# Patient Record
Sex: Female | Born: 1995
Health system: Southern US, Community
[De-identification: ages and names within clinical notes are randomized; demographics above are authoritative.]

## PROBLEM LIST (undated history)

## (undated) ENCOUNTER — Emergency Department: Admission: EM | Payer: Medicaid Other | Source: Home / Self Care

## (undated) DIAGNOSIS — I10 Essential (primary) hypertension: Secondary | ICD-10-CM

## (undated) DIAGNOSIS — F419 Anxiety disorder, unspecified: Secondary | ICD-10-CM

## (undated) DIAGNOSIS — G43909 Migraine, unspecified, not intractable, without status migrainosus: Secondary | ICD-10-CM

## (undated) DIAGNOSIS — K219 Gastro-esophageal reflux disease without esophagitis: Secondary | ICD-10-CM

## (undated) DIAGNOSIS — M1990 Unspecified osteoarthritis, unspecified site: Secondary | ICD-10-CM

## (undated) DIAGNOSIS — I428 Other cardiomyopathies: Secondary | ICD-10-CM

## (undated) DIAGNOSIS — I4891 Unspecified atrial fibrillation: Secondary | ICD-10-CM

## (undated) HISTORY — DX: Unspecified osteoarthritis, unspecified site: M19.90

## (undated) HISTORY — PX: CLUB FOOT RELEASE: SHX1363

## (undated) HISTORY — PX: WISDOM TOOTH EXTRACTION: SHX21

## (undated) HISTORY — DX: Essential (primary) hypertension: I10

## (undated) HISTORY — DX: Other cardiomyopathies: I42.8

## (undated) HISTORY — DX: Anxiety disorder, unspecified: F41.9

## (undated) HISTORY — DX: Gastro-esophageal reflux disease without esophagitis: K21.9

## (undated) HISTORY — DX: Migraine, unspecified, not intractable, without status migrainosus: G43.909

## (undated) HISTORY — DX: Unspecified atrial fibrillation: I48.91

---
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
## 2015-05-27 DIAGNOSIS — M25571 Pain in right ankle and joints of right foot: Secondary | ICD-10-CM | POA: Insufficient documentation

## 2017-09-30 DIAGNOSIS — G43119 Migraine with aura, intractable, without status migrainosus: Secondary | ICD-10-CM | POA: Insufficient documentation

## 2019-06-29 ENCOUNTER — Other Ambulatory Visit: Payer: Self-pay

## 2019-06-29 ENCOUNTER — Encounter: Payer: Medicaid Other | Admitting: Sports Medicine

## 2019-07-03 ENCOUNTER — Ambulatory Visit (INDEPENDENT_AMBULATORY_CARE_PROVIDER_SITE_OTHER): Payer: BC Managed Care – PPO | Admitting: Sports Medicine

## 2019-07-03 ENCOUNTER — Encounter: Payer: Self-pay | Admitting: Sports Medicine

## 2019-07-03 ENCOUNTER — Other Ambulatory Visit: Payer: Self-pay

## 2019-07-03 ENCOUNTER — Ambulatory Visit: Payer: BC Managed Care – PPO

## 2019-07-03 DIAGNOSIS — S63641A Sprain of metacarpophalangeal joint of right thumb, initial encounter: Secondary | ICD-10-CM

## 2019-07-03 DIAGNOSIS — S6991XA Unspecified injury of right wrist, hand and finger(s), initial encounter: Secondary | ICD-10-CM

## 2019-07-03 HISTORY — DX: Sprain of metacarpophalangeal joint of right thumb, initial encounter: S63.641A

## 2019-07-03 MED ORDER — MELOXICAM 15 MG PO TABS: ORAL_TABLET | ORAL | 3 refills | Status: DC

## 2019-07-03 NOTE — Progress Notes (Signed)
Subjective:    CC: Right thumb pain  HPI:  This is a pleasant 23 year old female, she works at [employer], over a month ago she injured her right thumb, pain was predominantly at the thumb IP joint.  It never fully got better and then she had a recent reinjury.  Pain is moderate, persistent, localized at the IP joint without radiation.  I reviewed the past medical history, family history, social history, surgical history, and allergies today and no changes were needed.  Please see the problem list section below in epic for further details.  Past Medical History: Past Medical History:  Diagnosis Date  . Arrhythmogenic right ventricular cardiomyopathy (HCC)   . Atrial fibrillation (HCC)   . Migraine    Past Surgical History: Past Surgical History:  Procedure Laterality Date  . CLUB FOOT RELEASE    . WISDOM TOOTH EXTRACTION    . WISDOM TOOTH EXTRACTION     Social History: Social History   Socioeconomic History  . Marital status: Single    Spouse name: Not on file  . Number of children: Not on file  . Years of education: Not on file  . Highest education level: Not on file  Occupational History  . Not on file  Social Needs  . Financial resource strain: Not on file  . Food insecurity    Worry: Not on file    Inability: Not on file  . Transportation needs    Medical: Not on file    Non-medical: Not on file  Tobacco Use  . Smoking status: Never Smoker  . Smokeless tobacco: Never Used  Substance and Sexual Activity  . Alcohol use: Not Currently  . Drug use: Never  . Sexual activity: Not on file  Lifestyle  . Physical activity    Days per week: Not on file    Minutes per session: Not on file  . Stress: Not on file  Relationships  . Social Musician on phone: Not on file    Gets together: Not on file    Attends religious service: Not on file    Active member of club or organization: Not on file    Attends meetings of clubs or organizations: Not on file   Relationship status: Not on file  Other Topics Concern  . Not on file  Social History Narrative  . Not on file   Family History: Family History  Problem Relation Age of Onset  . Hypertension Father   . Diabetes Paternal Grandmother   . Diabetes Paternal Grandfather    Allergies: Allergies  Allergen Reactions  . Oxycodone Nausea And Vomiting   Medications: See med rec.  Review of Systems: No headache, visual changes, nausea, vomiting, diarrhea, constipation, dizziness, abdominal pain, skin rash, fevers, chills, night sweats, swollen lymph nodes, weight loss, chest pain, body aches, joint swelling, muscle aches, shortness of breath, mood changes, visual or auditory hallucinations.  Objective:    General: Well Developed, well nourished, and in no acute distress.  Neuro: Alert and oriented x3, extra-ocular muscles intact, sensation grossly intact.  HEENT: Normocephalic, atraumatic, pupils equal round reactive to light, neck supple, no masses, no lymphadenopathy, thyroid nonpalpable.  Skin: Warm and dry, no rashes noted.  Cardiac: Regular rate and rhythm, no murmurs rubs or gallops.  Respiratory: Clear to auscultation bilaterally. Not using accessory muscles, speaking in full sentences.  Abdominal: Soft, nontender, nondistended, positive bowel sounds, no masses, no organomegaly.  Right hand: Good motion, good strength, tender to palpation  at the IP joint.  Collaterals are stable, neurovascularly intact distally.  Impression and Recommendations:    The patient was counselled, risk factors were discussed, anticipatory guidance given.  Injury of thumb, right Injury over a month ago, x-rays unremarkable, I do think she had an occult fracture. Thumb spica brace, switching to meloxicam, MRI of the right thumb. Out of work for 2 weeks, I do expect we will have to fill out short-term disability as she does work for [employer]. Return to see me in 2 weeks.    ___________________________________________ Gwen Her. Dianah Field, M.D., ABFM., CAQSM. Primary Care and Sports Medicine [facility]  Adjunct Professor of [place] [employer] [facility] [employer]

## 2019-07-03 NOTE — Assessment & Plan Note (Signed)
Injury over a month ago, x-rays unremarkable, I do think she had an occult fracture. Thumb spica brace, switching to meloxicam, MRI of the right thumb. Out of work for 2 weeks, I do expect we will have to fill out short-term disability as she does work for [employer]. Return to see me in 2 weeks.

## 2019-07-17 ENCOUNTER — Ambulatory Visit: Payer: Medicaid Other | Admitting: Sports Medicine

## 2019-07-18 ENCOUNTER — Encounter: Payer: Self-pay | Admitting: Sports Medicine

## 2019-07-18 ENCOUNTER — Other Ambulatory Visit: Payer: Self-pay

## 2019-07-18 ENCOUNTER — Ambulatory Visit (INDEPENDENT_AMBULATORY_CARE_PROVIDER_SITE_OTHER): Payer: BC Managed Care – PPO | Admitting: Sports Medicine

## 2019-07-18 DIAGNOSIS — S6991XD Unspecified injury of right wrist, hand and finger(s), subsequent encounter: Secondary | ICD-10-CM | POA: Diagnosis not present

## 2019-07-18 NOTE — Assessment & Plan Note (Addendum)
Thumb injury back in August, x-rays were unremarkable, I thought she had an occult fracture. We are still awaiting MRI which is scheduled for tomorrow, continue meloxicam. Short-term disability paperwork filled out and faxed off today. Follow-up will depend on MRI results.  MRI shows UCL sprain, she will likely need a spica cast for a month.

## 2019-07-18 NOTE — Progress Notes (Addendum)
Subjective:    CC: Follow-up  HPI: Vira returns, she is a pleasant 23 year old female with a thumb injury.  She is still awaiting MRI, she also had some disability paperwork to fill out today.  I reviewed the past medical history, family history, social history, surgical history, and allergies today and no changes were needed.  Please see the problem list section below in epic for further details.  Past Medical History: Past Medical History:  Diagnosis Date  . Arrhythmogenic right ventricular cardiomyopathy (HCC)   . Atrial fibrillation (HCC)   . Migraine    Past Surgical History: Past Surgical History:  Procedure Laterality Date  . CLUB FOOT RELEASE    . WISDOM TOOTH EXTRACTION    . WISDOM TOOTH EXTRACTION     Social History: Social History   Socioeconomic History  . Marital status: Single    Spouse name: Not on file  . Number of children: Not on file  . Years of education: Not on file  . Highest education level: Not on file  Occupational History  . Not on file  Social Needs  . Financial resource strain: Not on file  . Food insecurity    Worry: Not on file    Inability: Not on file  . Transportation needs    Medical: Not on file    Non-medical: Not on file  Tobacco Use  . Smoking status: Never Smoker  . Smokeless tobacco: Never Used  Substance and Sexual Activity  . Alcohol use: Not Currently  . Drug use: Never  . Sexual activity: Not on file  Lifestyle  . Physical activity    Days per week: Not on file    Minutes per session: Not on file  . Stress: Not on file  Relationships  . Social Musician on phone: Not on file    Gets together: Not on file    Attends religious service: Not on file    Active member of club or organization: Not on file    Attends meetings of clubs or organizations: Not on file    Relationship status: Not on file  Other Topics Concern  . Not on file  Social History Narrative  . Not on file   Family History:  Family History  Problem Relation Age of Onset  . Hypertension Father   . Diabetes Paternal Grandmother   . Diabetes Paternal Grandfather    Allergies: Allergies  Allergen Reactions  . Oxycodone Nausea And Vomiting   Medications: See med rec.  Review of Systems: No fevers, chills, night sweats, weight loss, chest pain, or shortness of breath.   Objective:    General: Well Developed, well nourished, and in no acute distress.  Neuro: Alert and oriented x3, extra-ocular muscles intact, sensation grossly intact.  HEENT: Normocephalic, atraumatic, pupils equal round reactive to light, neck supple, no masses, no lymphadenopathy, thyroid nonpalpable.  Skin: Warm and dry, no rashes. Cardiac: Regular rate and rhythm, no murmurs rubs or gallops, no lower extremity edema.  Respiratory: Clear to auscultation bilaterally. Not using accessory muscles, speaking in full sentences.  Disability paperwork filled out.  Impression and Recommendations:    Injury of thumb, right Thumb injury back in August, x-rays were unremarkable, I thought she had an occult fracture. We are still awaiting MRI which is scheduled for tomorrow, continue meloxicam. Short-term disability paperwork filled out and faxed off today. Follow-up will depend on MRI results.  MRI shows UCL sprain, she will likely need a spica cast  for a month.   ___________________________________________ Gwen Her. Dianah Field, M.D., ABFM., CAQSM. Primary Care and Sports Medicine [facility]  Adjunct Professor of [place] of [employer] Medicine

## 2019-07-23 NOTE — Progress Notes (Signed)
Patient was notified and did not have any questions.  

## 2019-08-01 ENCOUNTER — Encounter: Payer: Self-pay | Admitting: Sports Medicine

## 2019-08-01 ENCOUNTER — Ambulatory Visit (INDEPENDENT_AMBULATORY_CARE_PROVIDER_SITE_OTHER): Payer: BC Managed Care – PPO | Admitting: Sports Medicine

## 2019-08-01 ENCOUNTER — Other Ambulatory Visit: Payer: Self-pay

## 2019-08-01 DIAGNOSIS — S63641D Sprain of metacarpophalangeal joint of right thumb, subsequent encounter: Secondary | ICD-10-CM

## 2019-08-01 NOTE — Assessment & Plan Note (Addendum)
UCL partial tear on MRI. We placed her in a spica Exos cast today, I anticipate 4 to 6 weeks of casting, she will be out of work until then. I filled out part of her disability and FMLA paperwork today, she has a part that her boss needs to sign, she will return them tomorrow and we can fax them off and scan them.

## 2019-08-01 NOTE — Progress Notes (Signed)
Subjective:    CC: Follow-up  HPI: Anastassia returns, she is a pleasant 23 year old female who suffered an injury to her right thumb, ultimately an MRI showed partial tear of the UCL, she is here for further evaluation.  I reviewed the past medical history, family history, social history, surgical history, and allergies today and no changes were needed.  Please see the problem list section below in epic for further details.  Past Medical History: Past Medical History:  Diagnosis Date  . Arrhythmogenic right ventricular cardiomyopathy (HCC)   . Atrial fibrillation (HCC)   . Migraine    Past Surgical History: Past Surgical History:  Procedure Laterality Date  . CLUB FOOT RELEASE    . WISDOM TOOTH EXTRACTION    . WISDOM TOOTH EXTRACTION     Social History: Social History   Socioeconomic History  . Marital status: Single    Spouse name: Not on file  . Number of children: Not on file  . Years of education: Not on file  . Highest education level: Not on file  Occupational History  . Not on file  Social Needs  . Financial resource strain: Not on file  . Food insecurity    Worry: Not on file    Inability: Not on file  . Transportation needs    Medical: Not on file    Non-medical: Not on file  Tobacco Use  . Smoking status: Never Smoker  . Smokeless tobacco: Never Used  Substance and Sexual Activity  . Alcohol use: Not Currently  . Drug use: Never  . Sexual activity: Not on file  Lifestyle  . Physical activity    Days per week: Not on file    Minutes per session: Not on file  . Stress: Not on file  Relationships  . Social Musician on phone: Not on file    Gets together: Not on file    Attends religious service: Not on file    Active member of club or organization: Not on file    Attends meetings of clubs or organizations: Not on file    Relationship status: Not on file  Other Topics Concern  . Not on file  Social History Narrative  . Not on file    Family History: Family History  Problem Relation Age of Onset  . Hypertension Father   . Diabetes Paternal Grandmother   . Diabetes Paternal Grandfather    Allergies: Allergies  Allergen Reactions  . Oxycodone Nausea And Vomiting   Medications: See med rec.  Review of Systems: No fevers, chills, night sweats, weight loss, chest pain, or shortness of breath.   Objective:    General: Well Developed, well nourished, and in no acute distress.  Neuro: Alert and oriented x3, extra-ocular muscles intact, sensation grossly intact.  HEENT: Normocephalic, atraumatic, pupils equal round reactive to light, neck supple, no masses, no lymphadenopathy, thyroid nonpalpable.  Skin: Warm and dry, no rashes. Cardiac: Regular rate and rhythm, no murmurs rubs or gallops, no lower extremity edema.  Respiratory: Clear to auscultation bilaterally. Not using accessory muscles, speaking in full sentences.  Right sided EXOS a spica cast placed.  Impression and Recommendations:    Sprain of ulnar collateral ligament of metacarpophalangeal (MCP) joint of right thumb UCL partial tear on MRI. We placed her in a spica Exos cast today, I anticipate 4 to 6 weeks of casting, she will be out of work until then. I filled out part of her disability and FMLA paperwork  today, she has a part that her boss needs to sign, she will return them tomorrow and we can fax them off and scan them.   ___________________________________________ Gwen Her. Dianah Field, M.D., ABFM., CAQSM. Primary Care and Sports Medicine [facility]  Adjunct Professor of [place] [employer] [facility] [employer]

## 2019-08-28 ENCOUNTER — Encounter: Payer: Self-pay | Admitting: Sports Medicine

## 2019-08-28 ENCOUNTER — Telehealth: Payer: Self-pay | Admitting: Sports Medicine

## 2019-08-28 NOTE — Telephone Encounter (Signed)
Patient came in saying the insurance she uses is requesting a letter for her right thumb not being workers comp related. They said in some documentation you had checked a box saying it was a work injury. They saw in all previous documenting that it was not work related but for them to cover it they need a written letter confirming it. The letter needs to be faxed to  [phone].

## 2019-08-28 NOTE — Telephone Encounter (Signed)
Letter written, okay to fax.

## 2019-09-03 ENCOUNTER — Ambulatory Visit: Payer: BC Managed Care – PPO | Admitting: Sports Medicine

## 2019-09-05 ENCOUNTER — Other Ambulatory Visit: Payer: Self-pay

## 2019-09-05 ENCOUNTER — Encounter: Payer: Self-pay | Admitting: Sports Medicine

## 2019-09-05 ENCOUNTER — Ambulatory Visit (INDEPENDENT_AMBULATORY_CARE_PROVIDER_SITE_OTHER): Payer: BC Managed Care – PPO | Admitting: Sports Medicine

## 2019-09-05 DIAGNOSIS — S63641D Sprain of metacarpophalangeal joint of right thumb, subsequent encounter: Secondary | ICD-10-CM | POA: Diagnosis not present

## 2019-09-05 NOTE — Assessment & Plan Note (Addendum)
Pain is improving but still present, MCP is stable to application of valgus stress. She is now complaining of numbness in the thumb.  I would like her to transition to wearing the Exos spica at night and we are getting a nerve conduction study to evaluate for carpal tunnel syndrome versus C6 radiculitis. Adding hand physical therapy. Return to see me in 1 month.

## 2019-09-05 NOTE — Progress Notes (Signed)
Subjective:    CC: Follow-up  HPI: This is a pleasant 23 year old female, she has a history of an injury of her right thumb, ultimately diagnosed as a UCL injury, she has been in a spica brace for some time now, with good improvement in pain.  She is now complaining of numbness and tingling in her right thumb.  Moderate, persistent localized without radiation.  I reviewed the past medical history, family history, social history, surgical history, and allergies today and no changes were needed.  Please see the problem list section below in epic for further details.  Past Medical History: Past Medical History:  Diagnosis Date  . Arrhythmogenic right ventricular cardiomyopathy ([place])   . Atrial fibrillation ([place])   . Migraine    Past Surgical History: Past Surgical History:  Procedure Laterality Date  . CLUB FOOT RELEASE    . WISDOM TOOTH EXTRACTION    . WISDOM TOOTH EXTRACTION     Social History: Social History   Socioeconomic History  . Marital status: Single    Spouse name: Not on file  . Number of children: Not on file  . Years of education: Not on file  . Highest education level: Not on file  Occupational History  . Not on file  Social Needs  . Financial resource strain: Not on file  . Food insecurity    Worry: Not on file    Inability: Not on file  . Transportation needs    Medical: Not on file    Non-medical: Not on file  Tobacco Use  . Smoking status: Never Smoker  . Smokeless tobacco: Never Used  Substance and Sexual Activity  . Alcohol use: Not Currently  . Drug use: Never  . Sexual activity: Not on file  Lifestyle  . Physical activity    Days per week: Not on file    Minutes per session: Not on file  . Stress: Not on file  Relationships  . Social Herbalist on phone: Not on file    Gets together: Not on file    Attends religious service: Not on file    Active member of club or organization: Not on file    Attends meetings of clubs or  organizations: Not on file    Relationship status: Not on file  Other Topics Concern  . Not on file  Social History Narrative  . Not on file   Family History: Family History  Problem Relation Age of Onset  . Hypertension Father   . Diabetes Paternal Grandmother   . Diabetes Paternal Grandfather    Allergies: Allergies  Allergen Reactions  . Oxycodone Nausea And Vomiting   Medications: See med rec.  Review of Systems: No fevers, chills, night sweats, weight loss, chest pain, or shortness of breath.   Objective:    General: Well Developed, well nourished, and in no acute distress.  Neuro: Alert and oriented x3, extra-ocular muscles intact, sensation grossly intact.  HEENT: Normocephalic, atraumatic, pupils equal round reactive to light, neck supple, no masses, no lymphadenopathy, thyroid nonpalpable.  Skin: Warm and dry, no rashes. Cardiac: Regular rate and rhythm, no murmurs rubs or gallops, no lower extremity edema.  Respiratory: Clear to auscultation bilaterally. Not using accessory muscles, speaking in full sentences.  Impression and Recommendations:    Sprain of ulnar collateral ligament of metacarpophalangeal (MCP) joint of right thumb Pain is improving but still present, MCP is stable to application of valgus stress. She is now complaining of numbness in  the thumb.  I would like her to transition to wearing the Exos spica at night and we are getting a nerve conduction study to evaluate for carpal tunnel syndrome versus C6 radiculitis. Adding hand physical therapy. Return to see me in 1 month.   ___________________________________________ Ihor Austin. Benjamin Stain, M.D., ABFM., CAQSM. Primary Care and Sports Medicine [place] [facility]  Adjunct Professor of Family Medicine  [employer]

## 2019-09-17 ENCOUNTER — Other Ambulatory Visit: Payer: Self-pay

## 2019-09-17 ENCOUNTER — Encounter: Payer: Self-pay | Admitting: Physical Therapy

## 2019-09-17 ENCOUNTER — Ambulatory Visit (INDEPENDENT_AMBULATORY_CARE_PROVIDER_SITE_OTHER): Payer: BC Managed Care – PPO | Admitting: Physical Therapy

## 2019-09-17 DIAGNOSIS — M6281 Muscle weakness (generalized): Secondary | ICD-10-CM

## 2019-09-17 DIAGNOSIS — M25541 Pain in joints of right hand: Secondary | ICD-10-CM

## 2019-09-17 NOTE — Patient Instructions (Signed)
Access Code: [facility]  URL: [email or web address]  Date: 09/17/2019  Prepared by: Madelyn Flavors   Exercises Putty Squeezes - 10 reps - 2 sets - 2x daily - 7x weekly Tip Pinch with Putty - 10 reps - 2 sets - 2x daily - 7x weekly Thumb MCP and IP Flexion with Putty - 10 reps - 2 sets - 2x daily - 7x weekly 3-Point Pinch with Putty - 10 reps - 2 sets - 2x daily - 7x weekly Patient Education Trigger Point Dry Needling

## 2019-09-17 NOTE — Therapy (Signed)
[facility] [place] [address] [place], [place], [address] Phone: [phone]   Fax:  [phone]  Physical Therapy Evaluation  Patient Details  Name: Michele Ellison MRN: [id number] Date of Birth: July 13, 1996 Referring Provider (PT): Thekkenkandam   Encounter Date: 09/17/2019  PT End of Session - 09/17/19 0937    Visit Number  1    Number of Visits  12    Date for PT Re-Evaluation  10/29/19    PT Start Time  0936    PT Stop Time  1013    PT Time Calculation (min)  37 min    Activity Tolerance  Patient tolerated treatment well    Behavior During Therapy  [facility] for tasks assessed/performed       Past Medical History:  Diagnosis Date  . Arrhythmogenic right ventricular cardiomyopathy (HCC)   . Atrial fibrillation (HCC)   . Migraine     Past Surgical History:  Procedure Laterality Date  . CLUB FOOT RELEASE    . WISDOM TOOTH EXTRACTION    . WISDOM TOOTH EXTRACTION      There were no vitals filed for this visit.   Subjective Assessment - 09/17/19 0938    Subjective  Patient slammed her finger in the trunk of her car 05/29/19 and over time it worsened. She was casted for about one month. Now just wearing at night. Patient mainly has pain at end of day now. She has difficulty lifting and even holding her phone due to pain.    Pertinent History  Afib, migraines    Diagnostic tests  mri    Patient Stated Goals  get her strength back in her hand    Currently in Pain?  No/denies   8/10 when it hits        [facility] Assessment - 09/17/19 0001      Assessment   Medical Diagnosis  sprain of UCL of MCP joint of right thumb    Referring Provider (PT)  Thekkenkandam    Onset Date/Surgical Date  05/29/19    Hand Dominance  Right    Next MD Visit  after Christmas      Precautions   Precautions  None      Restrictions   Weight Bearing Restrictions  No      Balance Screen   Has the patient fallen in the past 6 months  No     Has the patient had a decrease in activity level because of a fear of falling?   No    Is the patient reluctant to leave their home because of a fear of falling?   No      Prior Function   Level of Independence  Independent    Vocation  Works at home      ROM / Strength   AROM / PROM / Strength  AROM;Strength      AROM   Overall AROM Comments  1st MCP flex 65 deg, -30 ext     AROM Assessment Site  Thumb    Right/Left Thumb  Right    Right Thumb Opposition  --   WNL     Strength   Strength Assessment Site  Hand    Right/Left hand  Right;Left    Right Hand Grip (lbs)  20/33/35 avg = 29 #    Right Hand Lateral Pinch  3 lbs   1/4/4   Right Hand 3 Point Pinch  1 lbs   .5/1/2# avg = 1  Left Hand Grip (lbs)  48/44/50 avg = 47#    Left Hand Lateral Pinch  8 lbs   8/8/8 avg = 8 #   Left Hand 3 Point Pinch  6 lbs   4/6/7 avg = 5.6#     Palpation   Palpation comment  thumb adductors, flexors, ABD tender with TPs; also first lumbrical                Objective measurements completed on examination: See above findings.      [facility] PT Treatment/Exercise - 09/17/19 0001      Exercises   Exercises  Hand      Hand Exercises   Other Hand Exercises  see HEP      Manual Therapy   Manual Therapy  Soft tissue mobilization    Soft tissue mobilization  IASTM to right thenar, adductor pollicis and first lumbrical             PT Education - 09/17/19 1537    Education Details  HEP;  DN education    Person(s) Educated  Patient    Methods  Explanation;Demonstration;Handout    Comprehension  Verbalized understanding;Returned demonstration          PT Long Term Goals - 09/17/19 1544      PT LONG TERM GOAL #1   Title  Ind with HEP to increase strength    Time  6    Period  Weeks    Status  New    Target Date  10/29/19      PT LONG TERM GOAL #2   Title  Patient to demo increased right hand grip and pincer strength equal or better to left hand to  normalize ADLS.    Time  6    Period  Weeks    Status  New      PT LONG TERM GOAL #3   Title  Patient able to perform ADLS without pain in her thumb.    Time  6    Period  Weeks    Status  New      PT LONG TERM GOAL #4   Title  Patient able to hold her phone without support from her LUE.    Time  6    Period  Weeks    Status  New      PT LONG TERM GOAL #5   Title  Patient to demo full first MCP extension to normalize funtion    Time  6    Period  Weeks    Status  New             Plan - 09/17/19 1538    Clinical Impression Statement  Patient c/o of right thumb pain beginning 05/29/19 when she slammed her thumb in the car trunk. She now has intermittent pain mainly with holding her phone or similar items. She has decreased grip and pincer strength and decreased ROM in her thumb. She has tenderness and TPs in the thenar eminence and adductor policis. These deficits are affecting ADLs including opening jars, carrying and lifting and pt will benefit from PT to address these deficits. She responded well to IASTM today and tolerated TE without increased pain.    Examination-Activity Limitations  Carry;Lift    Stability/Clinical Decision Making  Stable/Uncomplicated    Clinical Decision Making  Low    Rehab Potential  Excellent    PT Frequency  2x / week    PT Duration  6 weeks  PT Treatment/Interventions  ADLs/Self Care Home Management;Cryotherapy;Moist Heat;Ultrasound;Therapeutic exercise;Patient/family education;Manual techniques;Dry needling;Taping    PT Next Visit Plan  STW/DN to right thumb muscles, hand strength    PT Home Exercise Plan  [id number] (using foam ball and pill shaped foam)    Consulted and Agree with Plan of Care  Patient       Patient will benefit from skilled therapeutic intervention in order to improve the following deficits and impairments:  Decreased activity tolerance, Decreased strength, Increased muscle spasms, Impaired UE functional use, Pain,  Decreased range of motion  Visit Diagnosis: Pain in joints of right hand - Plan: PT plan of care cert/re-cert  Muscle weakness (generalized) - Plan: PT plan of care cert/re-cert     Problem List Patient Active Problem List   Diagnosis Date Noted  . Sprain of ulnar collateral ligament of metacarpophalangeal (MCP) joint of right thumb 07/03/2019    Solon PalmJulie Hubert Raatz PT 09/17/2019, 3:58 PM  [facility] [address] [place], [place], [address] Phone: [phone]   Fax:  [phone]  Name: Michele Ellison MRN: [id number] Date of Birth: 07/13/1996

## 2019-09-19 ENCOUNTER — Other Ambulatory Visit: Payer: Self-pay

## 2019-09-19 ENCOUNTER — Ambulatory Visit (INDEPENDENT_AMBULATORY_CARE_PROVIDER_SITE_OTHER): Payer: BC Managed Care – PPO | Admitting: Physical Therapy

## 2019-09-19 DIAGNOSIS — M6281 Muscle weakness (generalized): Secondary | ICD-10-CM | POA: Diagnosis not present

## 2019-09-19 DIAGNOSIS — M25541 Pain in joints of right hand: Secondary | ICD-10-CM

## 2019-09-19 NOTE — Therapy (Signed)
[facility] [address] [place], [place], [address] Phone: [phone]   Fax:  [phone]  Physical Therapy Treatment  Patient Details  Name: Williemae Muriel MRN: [id number] Date of Birth: 08-18-1996 Referring Provider (PT): Thekkenkandam   Encounter Date: 09/19/2019  PT End of Session - 09/19/19 0851    Visit Number  2    Number of Visits  12    Date for PT Re-Evaluation  10/29/19    PT Start Time  0849    PT Stop Time  0930    PT Time Calculation (min)  41 min    Activity Tolerance  Patient tolerated treatment well    Behavior During Therapy  [facility] for tasks assessed/performed       Past Medical History:  Diagnosis Date  . Arrhythmogenic right ventricular cardiomyopathy (HCC)   . Atrial fibrillation (HCC)   . Migraine     Past Surgical History:  Procedure Laterality Date  . CLUB FOOT RELEASE    . WISDOM TOOTH EXTRACTION    . WISDOM TOOTH EXTRACTION      There were no vitals filed for this visit.  Subjective Assessment - 09/19/19 0852    Subjective  Pt reports she was sore after last visit, but this resolved.  Biggest complaint is lack of strength.    Patient Stated Goals  get her strength back in her hand    Currently in Pain?  No/denies        [facility] Adult PT Treatment/Exercise - 09/19/19 0001      Self-Care   Self-Care  Other Self-Care Comments;Heat/Ice Application    Heat/Ice Application  Educated pt on parameters of ice/heat application.     Other Self-Care Comments   Educated pt on technique of self massage with golf ball to thenar emininance and forearm; pt verbalized understanding and returned demo. Pt educated on thumb muscle anatomy       Hand Exercises   Opposition  Right;5 reps   5 sec holds   Rubberbands  Rt fingers/thumb x 5 sec hold x 5 reps, (Lt fingers stabilizing MP joint) ;    Other Hand Exercises  yellow flex bar, twisting motion with both hands x 10 reps. Thumb adduction  (squeezing wash rag) x 5 sec x 5 reps;   Rt thumb stretches in all directions x 15 sec x 2 reps each direction     Other Hand Exercises   prohands gripmaster light tension with Rt thumb pressing down ontop of button x 5 sec hold x 5 reps;  hand master - light, hand grip/ finger ext x 20 reps       Manual Therapy   Manual therapy comments  I strip of reg Rock tape around PIP of thumb (x pattern on dorsal surface)    Soft tissue mobilization  IASTM to right thenar eminance, web space and lateral distal forearm.         PT Long Term Goals - 09/17/19 1544      PT LONG TERM GOAL #1   Title  Ind with HEP to increase strength    Time  6    Period  Weeks    Status  New    Target Date  10/29/19      PT LONG TERM GOAL #2   Title  Patient to demo increased right hand grip and pincer strength equal or better to left hand to normalize ADLS.    Time  6    Period  Weeks    Status  New      PT LONG TERM GOAL #3   Title  Patient able to perform ADLS without pain in her thumb.    Time  6    Period  Weeks    Status  New      PT LONG TERM GOAL #4   Title  Patient able to hold her phone without support from her LUE.    Time  6    Period  Weeks    Status  New      PT LONG TERM GOAL #5   Title  Patient to demo full first MCP extension to normalize funtion    Time  6    Period  Weeks    Status  New        HEP :  Added thumb stretches.  Issued rubber band and golf ball.  Pt verbalized understanding.     Plan - 09/19/19 1003    Clinical Impression Statement  Pt tender/ sore with thumb adduction AROM and thumb stretches into ext/ abdct.  She tolerated all exercises well, with only mild discomfort.  Added stretches into HEP.  Goals are ongoing at this time.    Examination-Activity Limitations  Carry;Lift    Stability/Clinical Decision Making  Stable/Uncomplicated    Rehab Potential  Excellent    PT Frequency  2x / week    PT Duration  6 weeks    PT Treatment/Interventions  ADLs/Self Care  Home Management;Cryotherapy;Moist Heat;Ultrasound;Therapeutic exercise;Patient/family education;Manual techniques;Dry needling;Taping    PT Next Visit Plan  STW/DN to right thumb muscles, hand strength    PT Home Exercise Plan  [id number] (using foam ball and pill shaped foam)    Consulted and Agree with Plan of Care  Patient       Patient will benefit from skilled therapeutic intervention in order to improve the following deficits and impairments:  Decreased activity tolerance, Decreased strength, Increased muscle spasms, Impaired UE functional use, Pain, Decreased range of motion  Visit Diagnosis: Pain in joints of right hand  Muscle weakness (generalized)     Problem List Patient Active Problem List   Diagnosis Date Noted  . Sprain of ulnar collateral ligament of metacarpophalangeal (MCP) joint of right thumb 07/03/2019   Kerin Perna, PTA 09/19/19 10:07 AM  [place], [place], [address] Phone: [phone]   Fax:  [phone]  Name: Amrita Radu MRN: [id number] Date of Birth: [birth year]

## 2019-09-25 ENCOUNTER — Encounter: Payer: Self-pay | Admitting: Rehabilitative and Restorative Service Providers"

## 2019-09-25 ENCOUNTER — Encounter (INDEPENDENT_AMBULATORY_CARE_PROVIDER_SITE_OTHER): Payer: Self-pay

## 2019-09-25 ENCOUNTER — Ambulatory Visit (INDEPENDENT_AMBULATORY_CARE_PROVIDER_SITE_OTHER): Payer: BC Managed Care – PPO | Admitting: Rehabilitative and Restorative Service Providers"

## 2019-09-25 ENCOUNTER — Other Ambulatory Visit: Payer: Self-pay

## 2019-09-25 DIAGNOSIS — M6281 Muscle weakness (generalized): Secondary | ICD-10-CM

## 2019-09-25 DIAGNOSIS — M25541 Pain in joints of right hand: Secondary | ICD-10-CM | POA: Diagnosis not present

## 2019-09-25 NOTE — Therapy (Signed)
[facility] [place] [address] [place], [place], [address] Phone: [phone]   Fax:  [phone]  Physical Therapy Treatment  Patient Details  Name: Michele Ellison MRN: [id number] Date of Birth: [birth year] Referring Provider (PT): Thekkenkandam   Encounter Date: 09/25/2019  PT End of Session - 09/25/19 0958    Visit Number  3    Number of Visits  12    Date for PT Re-Evaluation  10/29/19    PT Start Time  0945    PT Stop Time  1015    PT Time Calculation (min)  30 min    Activity Tolerance  Patient tolerated treatment well    Behavior During Therapy  [facility] for tasks assessed/performed       Past Medical History:  Diagnosis Date  . Arrhythmogenic right ventricular cardiomyopathy (HCC)   . Atrial fibrillation (HCC)   . Migraine     Past Surgical History:  Procedure Laterality Date  . CLUB FOOT RELEASE    . WISDOM TOOTH EXTRACTION    . WISDOM TOOTH EXTRACTION      There were no vitals filed for this visit.  Subjective Assessment - 09/25/19 0948    Subjective  The patient reports she was not sore after the last visit.  She is still not using the hand to lift heavier cases of water.  She has returned to cooking.  She still notes tightness with stretching.    Pertinent History  Afib, migraines    Diagnostic tests  mri    Patient Stated Goals  get her strength back in her hand    Currently in Pain?  No/denies                       [facility] Adult PT Treatment/Exercise - 09/25/19 0950      Exercises   Exercises  Hand      Hand Exercises   MCPJ Extension  Right;Strengthening;10 reps    MCPJ Extension Limitations  limited ROM R compared to left    DIPJ Flexion  Right;AROM;10 reps    DIPJ Flexion Limitations  thumb    DIPJ Extension  AROM;Right;10 reps    DIPJ Extension Limitations  thumb, with cues on full AROM    Digit Composite ADduction  Strengthening;Right;10 reps    Digit Composite Adduction  Limitations  --    Opposition  Right;Strengthening;5 reps    Thumb Opposition  performed opposition to each finger x 5 reps for warm up.    Digit Abduction/Adduction  Thumb ab/adduction x 10 reps * leads to discomfort in web space (along EPL tendon).    Rubberbands  Rt thumb with anchoring from Lt hand x 5 reps x 3 second holds with tactile and demo cues to move from MCP joint (versus hyperextension of DIP).      Other Hand Exercises  yellow bar, using thumb to press all 4 levels x 5 reps with rest in between due to fatigue; finkelstein's stretch with mild pain- cued patient to reduce tension and added to HEP; lumbrical exercises for HEP x 5 reps with fatigue noted    Other Hand Exercises  clothes pin grip provokes pain 7/10 after 5 reps with 3 second holds (pincer grip hold)      Manual Therapy   Manual Therapy  Soft tissue mobilization    Soft tissue mobilization  IASTM and STM to right thenar eminence, along R EPL tendon, and to first lumbrical.  PT Long Term Goals - 09/17/19 1544      PT LONG TERM GOAL #1   Title  Ind with HEP to increase strength    Time  6    Period  Weeks    Status  New    Target Date  10/29/19      PT LONG TERM GOAL #2   Title  Patient to demo increased right hand grip and pincer strength equal or better to left hand to normalize ADLS.    Time  6    Period  Weeks    Status  New      PT LONG TERM GOAL #3   Title  Patient able to perform ADLS without pain in her thumb.    Time  6    Period  Weeks    Status  New      PT LONG TERM GOAL #4   Title  Patient able to hold her phone without support from her LUE.    Time  6    Period  Weeks    Status  New      PT LONG TERM GOAL #5   Title  Patient to demo full first MCP extension to normalize funtion    Time  6    Period  Weeks    Status  New            Plan - 09/25/19 1131    Clinical Impression Statement  The patient arrives with no pain and gets increased pain with  resistance training (leaves clinic today at 4/10, but pain can go up to 7/10 with rubberband resisted thumb extension).  PT encouraged ice use at home to reduce inflammation and introduced extensor stretching and lumbrical AROM to address painful regions of hand. PT to continue to progress to LTGs with stretching, strengthening activities to improve functional use of R hand.    PT Treatment/Interventions  ADLs/Self Care Home Management;Cryotherapy;Moist Heat;Ultrasound;Therapeutic exercise;Patient/family education;Manual techniques;Dry needling;Taping    PT Next Visit Plan  STW/DN to right thumb muscles, hand strength    PT Home Exercise Plan  [id number] (using foam ball and pill shaped foam)    Consulted and Agree with Plan of Care  Patient       Patient will benefit from skilled therapeutic intervention in order to improve the following deficits and impairments:  Decreased activity tolerance, Decreased strength, Increased muscle spasms, Impaired UE functional use, Pain, Decreased range of motion  Visit Diagnosis: Pain in joints of right hand  Muscle weakness (generalized)     Problem List Patient Active Problem List   Diagnosis Date Noted  . Sprain of ulnar collateral ligament of metacarpophalangeal (MCP) joint of right thumb 07/03/2019    Elene Downum, PT 09/25/2019, 11:33 AM  [facility] [place], [place], [address] Phone: [phone]   Fax:  [phone]  Name: Michele Ellison MRN: [id number] Date of Birth: 11/07/1995

## 2019-09-25 NOTE — Patient Instructions (Signed)
Access Code: [facility]  URL: [email or web address]  Date: 09/25/2019  Prepared by: Rudell Cobb   Program Notes  For the rubberband exercise, perform 1x/day may begin at 5 reps and increase up to 10 reps as you can tolerate it.   Exercises Putty Squeezes - 10 reps - 2 sets - 2x daily - 7x weekly Tip Pinch with Putty - 10 reps - 2 sets - 2x daily - 7x weekly Thumb MCP and IP Flexion with Putty - 10 reps - 2 sets - 2x daily - 7x weekly 3-Point Pinch with Putty - 10 reps - 2 sets - 2x daily - 7x weekly Thumb PROM Composite Flexion and Extension - 3 reps - 1 sets - 15 seconds hold - 4x daily - 7x weekly Hand AROM Lumbrical - 5 reps - 1 sets - 2x daily - 7x weekly Thumb PROM Composite Extension - 3 reps - 1 sets - 15 seconds  hold - 4x daily - 7x weekly Finkelstein Stretch - 3 reps - 1 sets - 15 seconds hold - 2x daily - 7x weekly

## 2019-09-27 ENCOUNTER — Encounter: Payer: BC Managed Care – PPO | Admitting: Physical Therapy

## 2019-10-02 ENCOUNTER — Ambulatory Visit (INDEPENDENT_AMBULATORY_CARE_PROVIDER_SITE_OTHER): Payer: BC Managed Care – PPO | Admitting: Physical Therapy

## 2019-10-02 ENCOUNTER — Other Ambulatory Visit: Payer: Self-pay

## 2019-10-02 ENCOUNTER — Encounter: Payer: Self-pay | Admitting: Physical Therapy

## 2019-10-02 DIAGNOSIS — M25541 Pain in joints of right hand: Secondary | ICD-10-CM

## 2019-10-02 DIAGNOSIS — M6281 Muscle weakness (generalized): Secondary | ICD-10-CM

## 2019-10-02 NOTE — Therapy (Addendum)
[place] [facility] [employer] [place], [place], [address] Phone: [phone]   Fax:  [phone]  Physical Therapy Treatment and Discharge Summary  Patient Details  Name: Michele Ellison MRN: [id number] Date of Birth: 09-27-1996 Referring Provider (PT): Thekkenkandam   Encounter Date: 10/02/2019  PT End of Session - 10/02/19 0935    Visit Number  4    Number of Visits  12    Date for PT Re-Evaluation  10/29/19    PT Start Time  0936    PT Stop Time  1014    PT Time Calculation (min)  38 min    Activity Tolerance  Patient tolerated treatment well    Behavior During Therapy  [facility] for tasks assessed/performed       Past Medical History:  Diagnosis Date  . Arrhythmogenic right ventricular cardiomyopathy ([place])   . Atrial fibrillation ([place])   . Migraine     Past Surgical History:  Procedure Laterality Date  . CLUB FOOT RELEASE    . WISDOM TOOTH EXTRACTION    . WISDOM TOOTH EXTRACTION      There were no vitals filed for this visit.  Subjective Assessment - 10/02/19 0942    Subjective  Pt reports her pain in thumb has been elevated due to driving and lifting christmas packages.    Currently in Pain?  Yes    Pain Score  7     Pain Location  Finger (Comment which one)   thumb   Pain Orientation  Right    Pain Descriptors / Indicators  Aching;Sharp    Aggravating Factors   holding phone, picking up heavy objects    Pain Relieving Factors  ice, massage.         [facility] PT Assessment - 10/02/19 0001      Assessment   Medical Diagnosis  sprain of UCL of MCP joint of right thumb    Referring Provider (PT)  Thekkenkandam    Onset Date/Surgical Date  05/29/19    Hand Dominance  Right    Next MD Visit  after Christmas      Strength   Right Hand Grip (lbs)  41    Right Hand Lateral Pinch  6 lbs    Right Hand 3 Point Pinch  2 lbs    Left Hand Grip (lbs)  51    Left Hand Lateral Pinch  7 lbs    Left Hand 3 Point Pinch   4 lbs       [facility] Adult PT Treatment/Exercise - 10/02/19 0001      Elbow Exercises   Elbow Flexion  Right;5 reps    Bar Weights/Barbell (Elbow Flexion)  5 lbs      Hand Exercises   MCPJ Extension  Self ROM;Right;5 reps   15 sec holds   Other Hand Exercises  lumbrical flex/ext "hold the burger" x 20 reps     Other Hand Exercises  soft hand master ball grip and finger ext x 20 reps; Rt thumb opposition each finger 10x; yellow prohand pressing buttons with thumb in succession x 8 reps      Wrist Exercises   Wrist Extension  AAROM;Standing   stretch with hand on wall x 30 sec x 3 reps      Modalities   Modalities  Ultrasound      Ultrasound   Ultrasound Location  Rt thenar eminance and web space     Ultrasound Parameters  100%, 1.2 w/cm2, 8 min  Ultrasound Goals  Pain   tightness     Manual Therapy   Manual therapy comments  I strip of reg Rock tape around MCP of thumb (x pattern on dorsal surface)    Soft tissue mobilization  STM to right thenar eminence, along R EPL tendon, and to first lumbrical.         PT Long Term Goals - 10/02/19 1240      PT LONG TERM GOAL #1   Title  Ind with HEP to increase strength    Time  6    Period  Weeks    Status  On-going      PT LONG TERM GOAL #2   Title  Patient to demo increased right hand grip and pincer strength equal or better to left hand to normalize ADLS.    Time  6    Period  Weeks    Status  On-going      PT LONG TERM GOAL #3   Title  Patient able to perform ADLS without pain in her thumb.    Time  6    Period  Weeks    Status  On-going      PT LONG TERM GOAL #4   Title  Patient able to hold her phone without support from her LUE.    Time  6    Period  Weeks    Status  On-going      PT LONG TERM GOAL #5   Title  Patient to demo full first MCP extension to normalize funtion    Time  6    Period  Weeks    Status  On-going            Plan - 10/02/19 1232    Clinical Impression Statement  Pt  reporting overall improvement in functional mobility, noting ability to now cook with use of Rt hand. Pt demo good improvement in grip strength in Rt thumb and hand.  She participated well throughout despite elevated pain level.  Pt reported reduction of pain to 5/10 after US/ STM.  Pt issued additional strips of tape to apply to thumb until next appt.  Pt progressing towards goals.    Rehab Potential  Excellent    PT Frequency  2x / week    PT Duration  6 weeks    PT Treatment/Interventions  ADLs/Self Care Home Management;Cryotherapy;Moist Heat;Ultrasound;Therapeutic exercise;Patient/family education;Manual techniques;Dry needling;Taping    PT Next Visit Plan  STW/DN to right thumb muscles, hand strength    PT Home Exercise Plan  [id number] (using foam ball and pill shaped foam)    Consulted and Agree with Plan of Care  Patient       Patient will benefit from skilled therapeutic intervention in order to improve the following deficits and impairments:  Decreased activity tolerance, Decreased strength, Increased muscle spasms, Impaired UE functional use, Pain, Decreased range of motion  Visit Diagnosis: Pain in joints of right hand  Muscle weakness (generalized)     Problem List Patient Active Problem List   Diagnosis Date Noted  . Sprain of ulnar collateral ligament of metacarpophalangeal (MCP) joint of right thumb 07/03/2019   Kerin Perna, PTA 10/02/19 12:40 PM  [place], [place], [address] Phone: [phone]   Fax:  [phone]  Name: Michele Ellison MRN: [id number] Date of Birth: 1996/06/05   PHYSICAL THERAPY DISCHARGE SUMMARY  Visits from Start of Care: 4  Current functional level related to  goals / functional outcomes: Unable to assess as pt did not return for f/u visits   Remaining deficits: See above   Education / Equipment: HEP Plan: Patient agrees to discharge.  Patient  goals were partially met. Patient is being discharged due to not returning since the last visit.  ?????      Madelyn Flavors, PT 12/13/19 9:48 PM  [facility] [place], [place] [address]  [phone] (office) [phone] (fax)

## 2019-10-03 ENCOUNTER — Ambulatory Visit: Payer: BC Managed Care – PPO | Admitting: Sports Medicine

## 2019-10-15 ENCOUNTER — Ambulatory Visit: Payer: BC Managed Care – PPO | Admitting: Sports Medicine

## 2019-10-18 ENCOUNTER — Ambulatory Visit (INDEPENDENT_AMBULATORY_CARE_PROVIDER_SITE_OTHER): Payer: BC Managed Care – PPO | Admitting: Sports Medicine

## 2019-10-18 ENCOUNTER — Other Ambulatory Visit: Payer: Self-pay

## 2019-10-18 DIAGNOSIS — S63641D Sprain of metacarpophalangeal joint of right thumb, subsequent encounter: Secondary | ICD-10-CM

## 2019-10-18 NOTE — Assessment & Plan Note (Signed)
Michele Ellison returns, its been about 3-1/2 months since her injury. MRI confirmed UCL injury. She is doing well with hand therapy. She is out of her brace, and doing well. She is only a touch of pain, and occasional shooting sensations down her thumb. Nerve conduction study is scheduled. Her exam is completely unremarkable today. I think she can return to work on the 17th with light duty for 2 weeks and then full duty. I like to see her back after about a month to make sure everything is going well with full duty.

## 2019-10-18 NOTE — Progress Notes (Signed)
    Procedures performed today:    None.  Independent interpretation of tests performed by another provider:   None.  Impression and Recommendations:    Sprain of ulnar collateral ligament of metacarpophalangeal (MCP) joint of right thumb Michele Ellison returns, its been about 3-1/2 months since her injury. MRI confirmed UCL injury. She is doing well with hand therapy. She is out of her brace, and doing well. She is only a touch of pain, and occasional shooting sensations down her thumb. Nerve conduction study is scheduled. Her exam is completely unremarkable today. I think she can return to work on the 17th with light duty for 2 weeks and then full duty. I like to see her back after about a month to make sure everything is going well with full duty.    ___________________________________________ Ihor Austin. Benjamin Stain, M.D., ABFM., CAQSM. Primary Care and Sports Medicine [facility]  Adjunct Instructor of Family Medicine  [employer]

## 2019-10-30 ENCOUNTER — Encounter: Payer: BC Managed Care – PPO | Admitting: Neurology

## 2019-10-30 ENCOUNTER — Other Ambulatory Visit: Payer: Self-pay

## 2019-10-30 ENCOUNTER — Ambulatory Visit (INDEPENDENT_AMBULATORY_CARE_PROVIDER_SITE_OTHER): Payer: BC Managed Care – PPO | Admitting: Neurology

## 2019-10-30 DIAGNOSIS — M79641 Pain in right hand: Secondary | ICD-10-CM | POA: Insufficient documentation

## 2019-10-30 DIAGNOSIS — Z0289 Encounter for other administrative examinations: Secondary | ICD-10-CM

## 2019-10-30 NOTE — Progress Notes (Signed)
Full Name: Michele Ellison Gender: Female MRN #: [id number] Date of Birth: 01-14-1996    Visit Date: 10/30/2019 09:33 Age: 24 Years Examining Physician: Arlice Colt, MD  Referring Physician: Dianah Field, MD    History: Michele Ellison is a 24 year old woman with right hand pain predominantly in the right thumb.  She also notes a shocklike sensation that run down towards the thumb at times.  She denies weakness in the arm.  Nerve conduction studies: Bilateral median and ulnar motor responses had normal distal latencies, amplitudes and conduction velocities.  Bilateral ulnar responses had normal F-wave latencies.  Bilateral ulnar and median sensory responses had normal peak latencies and amplitudes.  Electromyography: Needle EMG of selected muscles of the right arm was performed.  Motor unit morphology and recruitment was normal in all of the muscles tested.  There was no abnormal spontaneous activity.  Impression: This is a normal NCV/EMG study of the right arm.  There is no evidence of neuropathy or significant radiculopathy.  Michele Ellison A. Michele Shelling, MD, PhD, FAAN Certified in Neurology, Clinical Neurophysiology, Sleep Medicine, Pain Medicine and Neuroimaging Director, [place] [facility] [address], [place], [place] [address] [phone]      [facility]    Nerve / Sites Muscle Latency Ref. Amplitude Ref. Rel Amp Segments Distance Velocity Ref. Area    ms ms mV mV %  cm m/s m/s mVms  L Median - APB     Wrist APB 3.3 ?4.4 7.8 ?4.0 100 Wrist - APB 7   25.6     Upper arm APB 6.4  8.0  102 Upper arm - Wrist 19 60 ?49 25.8  R Median - APB     Wrist APB 3.5 ?4.4 8.6 ?4.0 100 Wrist - APB 7   24.9     Upper arm APB 6.9  7.9  91.4 Upper arm - Wrist 19 56 ?49 22.4  L Ulnar - ADM     Wrist ADM 2.1 ?3.3 11.6 ?6.0 100 Wrist - ADM 7   30.1     B.Elbow ADM 4.6  10.4  89.4 B.Elbow - Wrist 17 66 ?49 29.0   A.Elbow ADM 6.1  10.4  99.9 A.Elbow - B.Elbow 10 67 ?49 29.8         A.Elbow - Wrist      R Ulnar - ADM     Wrist ADM 2.3 ?3.3 11.3 ?6.0 100 Wrist - ADM 7   33.0     B.Elbow ADM 5.2  10.4  92.4 B.Elbow - Wrist 18 63 ?49 31.2     A.Elbow ADM 6.8  10.5  100 A.Elbow - B.Elbow 10 63 ?49 32.2         A.Elbow - Wrist                 SNC    Nerve / Sites Rec. Site Peak Lat Ref.  Amp Ref. Segments Distance Peak Diff Ref.    ms ms V V  cm ms ms  L Median, Ulnar - Transcarpal comparison     Median Palm Wrist 2.1 ?2.2 99 ?35 Median Palm - Wrist 8       Ulnar Palm Wrist 2.0 ?2.2 34 ?12 Ulnar Palm - Wrist 8          Median Palm - Ulnar Palm  0.1 ?0.4  R Median, Ulnar - Transcarpal comparison     Median  Palm Wrist 2.0 ?2.2 63 ?35 Median Palm - Wrist 8       Ulnar Palm Wrist 1.9 ?2.2 47 ?12 Ulnar Palm - Wrist 8          Median Palm - Ulnar Palm  0.1 ?0.4  L Median - Orthodromic (Dig II, Mid palm)     Dig II Wrist 3.1 ?3.4 12 ?10 Dig II - Wrist 13    R Median - Orthodromic (Dig II, Mid palm)     Dig II Wrist 3.1 ?3.4 10 ?10 Dig II - Wrist 13    L Ulnar - Orthodromic, (Dig V, Mid palm)     Dig V Wrist 2.8 ?3.1 11 ?5 Dig V - Wrist 11    R Ulnar - Orthodromic, (Dig V, Mid palm)     Dig V Wrist 2.9 ?3.1 9 ?5 Dig V - Wrist 29                   F  Wave    Nerve F Lat Ref.   ms ms  L Ulnar - ADM 23.7 ?32.0  R Ulnar - ADM 25.1 ?32.0         EMG Summary Table    Spontaneous MUAP Recruitment  Muscle IA Fib PSW Fasc Other Amp Dur. Poly Pattern  R. Deltoid Normal None None None _______ Normal Normal Normal Normal  R. Triceps brachii Normal None None None _______ Normal Normal Normal Normal  R. Biceps brachii Normal None None None _______ Normal Normal Normal Normal  R. Extensor digitorum communis Normal None None None _______ Normal Normal Normal Normal  R. Flexor carpi ulnaris Normal None None None _______ Normal Normal Normal Normal  R. First dorsal interosseous Normal None None None _______  Normal Normal Normal Normal

## 2019-11-15 ENCOUNTER — Ambulatory Visit: Payer: BC Managed Care – PPO | Admitting: Sports Medicine

## 2019-11-19 ENCOUNTER — Encounter: Payer: Self-pay | Admitting: Sports Medicine

## 2019-11-19 ENCOUNTER — Other Ambulatory Visit: Payer: Self-pay

## 2019-11-19 ENCOUNTER — Ambulatory Visit (INDEPENDENT_AMBULATORY_CARE_PROVIDER_SITE_OTHER): Payer: BC Managed Care – PPO | Admitting: Sports Medicine

## 2019-11-19 DIAGNOSIS — S63641D Sprain of metacarpophalangeal joint of right thumb, subsequent encounter: Secondary | ICD-10-CM | POA: Diagnosis not present

## 2019-11-19 NOTE — Progress Notes (Signed)
    Procedures performed today:    None.  Independent interpretation of tests performed by another provider:   None.  Impression and Recommendations:    Sprain of ulnar collateral ligament of metacarpophalangeal (MCP) joint of right thumb Michele Ellison returns, she had an MRI confirmed ulnar collateral ligament injury of the thumb. Symptoms are gone, and she is returning to work today. She does have some forms that need to be filled out.  I spent 30 minutes of total time managing this patient today, this includes chart review, face to face, and non-face to face time.   ___________________________________________ Michele Ellison. Benjamin Stain, M.D., ABFM., CAQSM. Primary Care and Sports Medicine [facility]  Adjunct Instructor of Family Medicine  [employer]

## 2019-11-19 NOTE — Assessment & Plan Note (Signed)
Michele Ellison returns, she had an MRI confirmed ulnar collateral ligament injury of the thumb. Symptoms are gone, and she is returning to work today. She does have some forms that need to be filled out.

## 2020-10-09 ENCOUNTER — Other Ambulatory Visit: Payer: Self-pay

## 2020-10-09 ENCOUNTER — Encounter: Payer: Self-pay | Admitting: Emergency Medicine

## 2020-10-09 ENCOUNTER — Emergency Department (INDEPENDENT_AMBULATORY_CARE_PROVIDER_SITE_OTHER): Payer: BC Managed Care – PPO

## 2020-10-09 ENCOUNTER — Emergency Department
Admission: EM | Admit: 2020-10-09 | Discharge: 2020-10-09 | Disposition: A | Discharge status: Home / Self Care | Payer: BC Managed Care – PPO | Source: Home / Self Care

## 2020-10-09 DIAGNOSIS — D709 Neutropenia, unspecified: Secondary | ICD-10-CM

## 2020-10-09 DIAGNOSIS — R1011 Right upper quadrant pain: Secondary | ICD-10-CM

## 2020-10-09 DIAGNOSIS — N83202 Unspecified ovarian cyst, left side: Secondary | ICD-10-CM

## 2020-10-09 DIAGNOSIS — R109 Unspecified abdominal pain: Secondary | ICD-10-CM | POA: Diagnosis not present

## 2020-10-09 DIAGNOSIS — R112 Nausea with vomiting, unspecified: Secondary | ICD-10-CM

## 2020-10-09 DIAGNOSIS — N83209 Unspecified ovarian cyst, unspecified side: Secondary | ICD-10-CM

## 2020-10-09 DIAGNOSIS — K5900 Constipation, unspecified: Secondary | ICD-10-CM

## 2020-10-09 LAB — POCT CBC W AUTO DIFF (K'VILLE URGENT CARE)

## 2020-10-09 LAB — POCT URINALYSIS DIP (MANUAL ENTRY)
Bilirubin, UA: NEGATIVE
Blood, UA: NEGATIVE
Glucose, UA: NEGATIVE mg/dL
Ketones, POC UA: NEGATIVE mg/dL
Leukocytes, UA: NEGATIVE
Nitrite, UA: NEGATIVE
Protein Ur, POC: NEGATIVE mg/dL
Spec Grav, UA: 1.02 (ref 1.010–1.025)
Urobilinogen, UA: 1 E.U./dL
pH, UA: 7 (ref 5.0–8.0)

## 2020-10-09 MED ORDER — IOHEXOL 300 MG/ML  SOLN
100.0000 mL | Freq: Once | INTRAMUSCULAR | Status: AC | PRN
  Administered 2020-10-09: 11:00:00 100 mL via INTRAVENOUS

## 2020-10-09 MED ORDER — POLYETHYLENE GLYCOL 3350 17 G PO PACK: 17.0000 g | PACK | Freq: Every day | ORAL | 0 refills | Status: DC

## 2020-10-09 MED ORDER — ONDANSETRON 4 MG PO TBDP: 4.0000 mg | ORAL_TABLET | Freq: Three times a day (TID) | ORAL | 0 refills | Status: DC | PRN

## 2020-10-09 NOTE — Discharge Instructions (Addendum)
  Call to schedule a follow up appointment with primary care next week for recheck of symptoms and further evaluation of your abnormal blood work (low white blood cells).  You will be notified if any additional abnormalities come back in 1-2 days with the lab work that is pending from today.    Call 911 or have someone drive you to the hospital if symptoms significantly worsening.

## 2020-10-09 NOTE — ED Provider Notes (Signed)
[facility]    CSN: [id number] Arrival date & time: 10/09/20  0850      History   Chief Complaint Chief Complaint  Patient presents with  . Abdominal Pain    HPI Michele Ellison is a 24 y.o. female.   HPI  Michele Ellison is a 24 y.o. female presenting to UC with c/o RUQ/right lower rib pain that started about 2 weeks ago with associated constipation and decreased appetite due to sensation of fullness and mild nausea. Pain is aching and sharp, worse over the last 6 days.  She had one small hard BM today and one two days ago.  She had vomiting 2 days ago, none since then. States she has only been able to eat fries and chips but states that aggravates the pain too.  No known sick contacts. Denies urinary or vaginal symptoms. Still has appendix and gallbladder. No hx of kidney stones. LMP: 09/29/20. No concern for pregnancy. No prior hx of ovarian cysts or fibroids but reports seeing her GYN in July for pelvic pain but reports having an normal [place] at that time. States he mother and grandmother have had fibroids.   Per medical records, pt was evaluated for same symptoms, instructed to take flexeril and mobic for suspected abdominal muscle strain, pt works at [employer] as an Therapist, music.   Past Medical History:  Diagnosis Date  . Arrhythmogenic right ventricular cardiomyopathy (HCC)   . Atrial fibrillation (HCC)   . Migraine     Patient Active Problem List   Diagnosis Date Noted  . Right hand pain 10/30/2019  . Sprain of ulnar collateral ligament of metacarpophalangeal (MCP) joint of right thumb 07/03/2019    Past Surgical History:  Procedure Laterality Date  . CLUB FOOT RELEASE    . WISDOM TOOTH EXTRACTION    . WISDOM TOOTH EXTRACTION      OB History   No obstetric history on file.      Home Medications    Prior to Admission medications   Medication Sig Start Date End Date Taking? Authorizing Provider  ondansetron (ZOFRAN-ODT) 4 MG disintegrating tablet Take 1  tablet (4 mg total) by mouth every 8 (eight) hours as needed for nausea or vomiting. 10/09/20  Yes Jayra Choyce O, PA-C  polyethylene glycol (MIRALAX / GLYCOLAX) 17 g packet Take 17 g by mouth daily. 10/09/20  Yes Lurene Shadow, PA-C    Family History Family History  Problem Relation Age of Onset  . Hypertension Father   . Diabetes Paternal Grandmother   . Diabetes Paternal Grandfather   . Hypertension Mother     Social History Social History   Tobacco Use  . Smoking status: Never Smoker  . Smokeless tobacco: Never Used  Vaping Use  . Vaping Use: Never used  Substance Use Topics  . Alcohol use: Yes  . Drug use: Never     Allergies   Oxycodone   Review of Systems Review of Systems  Constitutional: Negative for chills and fever.  HENT: Negative for congestion, ear pain, sore throat, trouble swallowing and voice change.   Respiratory: Negative for cough and shortness of breath.   Cardiovascular: Negative for chest pain and palpitations.  Gastrointestinal: Positive for abdominal pain, constipation and nausea. Negative for diarrhea and vomiting.  Genitourinary: Negative for dysuria, flank pain, frequency and hematuria.  Musculoskeletal: Negative for arthralgias, back pain and myalgias.  Skin: Negative for rash.  All other systems reviewed and are negative.    Physical Exam Triage Vital  Signs ED Triage Vitals  Enc Vitals Group     BP 10/09/20 0926 125/86     Pulse Rate 10/09/20 0926 81     Resp --      Temp 10/09/20 0926 98.7 F (37.1 C)     Temp Source 10/09/20 0926 Oral     SpO2 10/09/20 0926 97 %     Weight 10/09/20 0927 113 lb (51.3 kg)     Height 10/09/20 0927 4\' 11"  (1.499 m)     Head Circumference --      Peak Flow --      Pain Score 10/09/20 0927 10     Pain Loc --      Pain Edu? --      Excl. in GC? --    No data found.  Updated Vital Signs BP 125/86 (BP Location: Right Arm)   Pulse 81   Temp 98.7 F (37.1 C) (Oral)   Ht 4\' 11"  (1.499 m)    Wt 113 lb (51.3 kg)   LMP 09/29/2020 (Exact Date)   SpO2 97%   BMI 22.82 kg/m   Visual Acuity Right Eye Distance:   Left Eye Distance:   Bilateral Distance:    Right Eye Near:   Left Eye Near:    Bilateral Near:     Physical Exam Vitals and nursing note reviewed.  Constitutional:      General: She is not in acute distress.    Appearance: She is well-developed and well-nourished. She is not ill-appearing, toxic-appearing or diaphoretic.  HENT:     Head: Normocephalic and atraumatic.     Nose: Nose normal.     Mouth/Throat:     Lips: Pink.     Mouth: Mucous membranes are moist.     Pharynx: Oropharynx is clear. Uvula midline.  Eyes:     Extraocular Movements: EOM normal.  Cardiovascular:     Rate and Rhythm: Normal rate and regular rhythm.  Pulmonary:     Effort: Pulmonary effort is normal. No respiratory distress.     Breath sounds: Normal breath sounds.  Abdominal:     Tenderness: There is abdominal tenderness ( tenderness to Right side of abdomen) in the right upper quadrant, right lower quadrant, epigastric area, periumbilical area and suprapubic area. There is no right CVA tenderness, left CVA tenderness, guarding or rebound. Negative signs include Murphy's sign and McBurney's sign.  Musculoskeletal:        General: Normal range of motion.     Cervical back: Normal range of motion.  Skin:    General: Skin is warm and dry.  Neurological:     Mental Status: She is alert and oriented to person, place, and time.  Psychiatric:        Mood and Affect: Mood and affect normal.        Behavior: Behavior normal.      UC Treatments / Results  Labs (all labs ordered are listed, but only abnormal results are displayed) Labs Reviewed  COMPLETE METABOLIC PANEL WITH GFR  LIPASE  POCT CBC W AUTO DIFF ([facility])  POCT URINALYSIS DIP (MANUAL ENTRY)    EKG   Radiology CT ABDOMEN PELVIS W CONTRAST  Result Date: 10/09/2020 CLINICAL DATA:  24 year old female  with right upper quadrant abdominal pain for 2 weeks radiating to the lower abdomen. EXAM: CT ABDOMEN AND PELVIS WITH CONTRAST TECHNIQUE: Multidetector CT imaging of the abdomen and pelvis was performed using the standard protocol following bolus administration of intravenous contrast.  CONTRAST:  100mL OMNIPAQUE IOHEXOL 300 MG/ML  SOLN COMPARISON:  None. FINDINGS: Lower chest: Negative. Hepatobiliary: Partially contracted gallbladder appears normal. Liver is within normal limits. No bile duct enlargement. Pancreas: Negative. Spleen: Negative. Adrenals/Urinary Tract: Normal adrenal glands. Bilateral renal enhancement is symmetric and within normal limits. No nephrolithiasis or hydronephrosis. No perinephric stranding. Proximal ureters appear decompressed. Unremarkable urinary bladder. Stomach/Bowel: Retained stool in the rectum. Decompressed and mildly redundant sigmoid colon. Decompressed descending colon and transverse colon. The cecum is mostly located in the anterior pelvis and normal appendix is identified tracking from the midline to the right pelvic side wall (series 2, image 62 and coronal image 36). There is a small volume of free fluid surrounding the cecum and in the pelvis. But no focal large bowel inflammation. Terminal ileum is within normal limits. Oral contrast has not yet reached the distal small bowel. No dilated small bowel loops. Stomach and duodenum are within normal limits, the latter completely decompressed. No free air. No abdominal free fluid. Vascular/Lymphatic: Major arterial structures are patent and normal. Portal venous system is patent. No lymphadenopathy. Reproductive: Within normal limits; mild asymmetric enlargement of the left adnexa, although containing a crenulated and enhancing ovarian cyst or follicle as seen on coronal image 47. Other: Small volume of pelvic free fluid situated both anterior and posterior to the uterus. Mildly complex fluid density. Musculoskeletal: Negative.  IMPRESSION: 1. Small volume of mildly complex appearing free fluid in the pelvis including surrounding the cecum. However, the appendix is identified and appears normal. At the same time, the left ovary is mildly enlarged and contains a crenulated/enhancing cyst or follicle. Favor sequelae of ruptured ovarian cyst. PID is felt less likely. 2. Negative CT of the abdomen. Electronically Signed   By: Odessa FlemingH  Hall M.D.   On: 10/09/2020 10:58    Procedures Procedures (including critical care time)  Medications Ordered in UC Medications - No data to display  Initial Impression / Assessment and Plan / UC Course  I have reviewed the triage vital signs and the nursing notes.  Pertinent labs & imaging results that were available during my care of the patient were reviewed by me and considered in my medical decision making (see chart for details).     CBC: neutropenia noted, possible viral gastroenteritis with reported vomiting the other day CMP and Lipase: pending UA: WNL Discussed imaging with pt, no evidence of acute abdomen at this time- normal appendix, gallbladder and pancrease.  Pt reports family hx of ovarian fibroids Encouraged symptomatic tx and close f/u with PCP by early next week Discussed symptoms that warrant emergent care in the ED. AVS given  Final Clinical Impressions(s) / UC Diagnoses   Final diagnoses:  Right sided abdominal pain  Constipation, unspecified constipation type  Nausea and vomiting in adult patient  Neutropenia, unspecified type (HCC)  Cyst of ovary, unspecified laterality     Discharge Instructions      Call to schedule a follow up appointment with primary care next week for recheck of symptoms and further evaluation of your abnormal blood work (low white blood cells).  You will be notified if any additional abnormalities come back in 1-2 days with the lab work that is pending from today.    Call 911 or have someone drive you to the hospital if symptoms  significantly worsening.     ED Prescriptions    Medication Sig Dispense Auth. Provider   ondansetron (ZOFRAN-ODT) 4 MG disintegrating tablet Take 1 tablet (4 mg total) by  mouth every 8 (eight) hours as needed for nausea or vomiting. 12 tablet Doroteo Glassman, Alexius Hangartner O, PA-C   polyethylene glycol (MIRALAX / GLYCOLAX) 17 g packet Take 17 g by mouth daily. 14 each Lurene Shadow, PA-C     PDMP not reviewed this encounter.   Lurene Shadow, [place] 10/09/20 1337

## 2020-10-09 NOTE — ED Triage Notes (Signed)
Rt Rib pain, constipation x 2 weeks, worse the last 6 days Unvaccinated

## 2020-10-10 LAB — COMPLETE METABOLIC PANEL WITH GFR
AG Ratio: 1.8 (calc) (ref 1.0–2.5)
ALT: 33 U/L — ABNORMAL HIGH (ref 6–29)
AST: 31 U/L — ABNORMAL HIGH (ref 10–30)
Albumin: 4.4 g/dL (ref 3.6–5.1)
Alkaline phosphatase (APISO): 36 U/L (ref 31–125)
BUN: 7 mg/dL (ref 7–25)
CO2: 27 mmol/L (ref 20–32)
Calcium: 9 mg/dL (ref 8.6–10.2)
Chloride: 104 mmol/L (ref 98–110)
Creat: 0.68 mg/dL (ref 0.50–1.10)
GFR, Est African American: 142 mL/min/{1.73_m2} (ref >=60)
GFR, Est Non African American: 122 mL/min/{1.73_m2} (ref >=60)
Globulin: 2.4 g/dL (calc) (ref 1.9–3.7)
Glucose, Bld: 80 mg/dL (ref 65–99)
Potassium: 3.5 mmol/L (ref 3.5–5.3)
Sodium: 140 mmol/L (ref 135–146)
Total Bilirubin: 0.2 mg/dL (ref 0.2–1.2)
Total Protein: 6.8 g/dL (ref 6.1–8.1)

## 2020-10-10 LAB — LIPASE: Lipase: 6 U/L — ABNORMAL LOW (ref 7–60)

## 2020-10-22 ENCOUNTER — Other Ambulatory Visit: Payer: Self-pay

## 2020-10-22 ENCOUNTER — Encounter: Payer: BC Managed Care – PPO | Admitting: Medical-Surgical

## 2020-10-22 NOTE — Progress Notes (Signed)
Erroneous encounter. Patient arrived late and was rescheduled.

## 2020-11-06 ENCOUNTER — Ambulatory Visit (INDEPENDENT_AMBULATORY_CARE_PROVIDER_SITE_OTHER): Payer: BC Managed Care – PPO | Admitting: Medical-Surgical

## 2020-11-06 ENCOUNTER — Encounter: Payer: Self-pay | Admitting: Medical-Surgical

## 2020-11-06 ENCOUNTER — Other Ambulatory Visit: Payer: Self-pay

## 2020-11-06 VITALS — BP 112/60 | HR 76 | Temp 98.5°F | Ht 60.0 in | Wt 112.3 lb

## 2020-11-06 DIAGNOSIS — R6889 Other general symptoms and signs: Secondary | ICD-10-CM | POA: Diagnosis not present

## 2020-11-06 DIAGNOSIS — Z0000 Encounter for general adult medical examination without abnormal findings: Secondary | ICD-10-CM

## 2020-11-06 DIAGNOSIS — Z7689 Persons encountering health services in other specified circumstances: Secondary | ICD-10-CM | POA: Diagnosis not present

## 2020-11-06 DIAGNOSIS — Z1329 Encounter for screening for other suspected endocrine disorder: Secondary | ICD-10-CM

## 2020-11-06 NOTE — Progress Notes (Signed)
New Patient Office Visit  Subjective:  Patient ID: Michele Ellison, female    DOB: [birth year]  Age: 25 y.o. MRN: [id number]  CC:  Chief Complaint  Patient presents with  . Establish Care    HPI Michele Ellison presents to establish care.   History of A. fib but no recent issues.  She is not taking any medications for management of this.  Also has a history of migraines which do tend to be related to her menstrual cycle.  Notes that she has 1 day of significant headache with photophobia and photophonia.  She usually sleeps these all four goes into a dark room.  If significantly painful, she may take the occasional Tylenol or ibuprofen but she prefers not to.  She is in a committed monogamous relationship with one female partner and they are planning on pregnancy sometime this year.  Presents today wanting to make sure that she is healthy with no significant issues before proceeding with artificial insemination attempts.  Past Medical History:  Diagnosis Date  . Arrhythmogenic right ventricular cardiomyopathy (HCC)   . Atrial fibrillation (HCC)   . Migraine     Past Surgical History:  Procedure Laterality Date  . CLUB FOOT RELEASE    . WISDOM TOOTH EXTRACTION    . WISDOM TOOTH EXTRACTION      Family History  Problem Relation Age of Onset  . Hypertension Father   . Diabetes Paternal Grandmother   . Diabetes Paternal Grandfather   . Hypertension Mother     Social History   Socioeconomic History  . Marital status: Single    Spouse name: Not on file  . Number of children: Not on file  . Years of education: Not on file  . Highest education level: Not on file  Occupational History  . Not on file  Tobacco Use  . Smoking status: Never Smoker  . Smokeless tobacco: Never Used  Vaping Use  . Vaping Use: Never used  Substance and Sexual Activity  . Alcohol use: Yes    Comment: Rarely  . Drug use: Never  . Sexual activity: Yes    Partners: Female  Other Topics Concern  .  Not on file  Social History Narrative  . Not on file   Social Determinants of Health   Financial Resource Strain: Not on file  Food Insecurity: Not on file  Transportation Needs: Not on file  Physical Activity: Not on file  Stress: Not on file  Social Connections: Not on file  Intimate Partner Violence: Not on file    ROS Review of Systems  Constitutional: Negative for chills, fatigue, fever and unexpected weight change.  Respiratory: Negative for cough, chest tightness, shortness of breath and wheezing.   Cardiovascular: Negative for chest pain, palpitations and leg swelling.  Endocrine: Positive for cold intolerance. Negative for heat intolerance.  Neurological: Negative for dizziness, light-headedness and headaches.  Psychiatric/Behavioral: Negative for dysphoric mood, self-injury, sleep disturbance and suicidal ideas. The patient is not nervous/anxious.     Objective:   Today's Vitals: BP 112/60   Pulse 76   Temp 98.5 F (36.9 C)   Ht 5' (1.524 m)   Wt 112 lb 4.8 oz (50.9 kg)   LMP 10/22/2020   SpO2 100%   BMI 21.93 kg/m   Physical Exam Vitals reviewed.  Constitutional:      General: She is not in acute distress.    Appearance: Normal appearance.  HENT:     Head: Normocephalic and atraumatic.  Cardiovascular:  Rate and Rhythm: Normal rate and regular rhythm.     Pulses: Normal pulses.     Heart sounds: Normal heart sounds. No murmur heard. No friction rub. No gallop.   Pulmonary:     Effort: Pulmonary effort is normal. No respiratory distress.     Breath sounds: Normal breath sounds. No wheezing.  Abdominal:     General: Bowel sounds are normal. There is no distension.     Palpations: Abdomen is soft. There is no mass.     Tenderness: There is abdominal tenderness (mild epigastric and LLQ tenderness with deep palpation). There is no guarding or rebound.     Hernia: No hernia is present.  Skin:    General: Skin is warm and dry.  Neurological:      Mental Status: She is alert and oriented to person, place, and time.  Psychiatric:        Mood and Affect: Mood normal.        Behavior: Behavior normal.        Thought Content: Thought content normal.        Judgment: Judgment normal.     Assessment & Plan:   1. Encounter to establish care Reviewed available information and discussed care concerns with patient.   2. Preventative health care Check CBC w/diff and CMP as her last labs showed neutropenia and mild elevations in liver enzymes.  - CBC w/Diff/Platelet - COMPLETE METABOLIC PANEL WITH GFR  3. Cold intolerance Checking TSH. - TSH  4. Screening for endocrine disorder Checking TSH. - TSH   Outpatient Encounter Medications as of 11/06/2020  Medication Sig  . [DISCONTINUED] ondansetron (ZOFRAN-ODT) 4 MG disintegrating tablet Take 1 tablet (4 mg total) by mouth every 8 (eight) hours as needed for nausea or vomiting.  . [DISCONTINUED] polyethylene glycol (MIRALAX / GLYCOLAX) 17 g packet Take 17 g by mouth daily.   No facility-administered encounter medications on file as of 11/06/2020.    Follow-up: Return if symptoms worsen or fail to improve. If no pregnancy by January of 2023, return for annual exam.   Thayer Ohm, DNP, APRN, FNP-BC [facility]

## 2020-11-07 LAB — CBC WITH DIFFERENTIAL/PLATELET
Absolute Monocytes: 320 {cells}/uL (ref 200–950)
Basophils Absolute: 29 {cells}/uL (ref 0–200)
Basophils Relative: 0.8 %
Eosinophils Absolute: 61 {cells}/uL (ref 15–500)
Eosinophils Relative: 1.7 %
HCT: 37 % (ref 35.0–45.0)
Hemoglobin: 12.1 g/dL (ref 11.7–15.5)
Lymphs Abs: 1080 {cells}/uL (ref 850–3900)
MCH: 29.4 pg (ref 27.0–33.0)
MCHC: 32.7 g/dL (ref 32.0–36.0)
MCV: 89.8 fL (ref 80.0–100.0)
MPV: 11.2 fL (ref 7.5–12.5)
Monocytes Relative: 8.9 %
Neutro Abs: 2110 {cells}/uL (ref 1500–7800)
Neutrophils Relative %: 58.6 %
Platelets: 164 10*3/uL (ref 140–400)
RBC: 4.12 Million/uL (ref 3.80–5.10)
RDW: 12.1 % (ref 11.0–15.0)
Total Lymphocyte: 30 %
WBC: 3.6 10*3/uL — ABNORMAL LOW (ref 3.8–10.8)

## 2020-11-07 LAB — COMPLETE METABOLIC PANEL WITHOUT GFR
AG Ratio: 2 (calc) (ref 1.0–2.5)
ALT: 12 U/L (ref 6–29)
AST: 17 U/L (ref 10–30)
Albumin: 4.5 g/dL (ref 3.6–5.1)
Alkaline phosphatase (APISO): 48 U/L (ref 31–125)
BUN: 10 mg/dL (ref 7–25)
CO2: 30 mmol/L (ref 20–32)
Calcium: 9.4 mg/dL (ref 8.6–10.2)
Chloride: 107 mmol/L (ref 98–110)
Creat: 0.62 mg/dL (ref 0.50–1.10)
GFR, Est African American: 146 mL/min/{1.73_m2}
GFR, Est Non African American: 126 mL/min/{1.73_m2}
Globulin: 2.3 g/dL (ref 1.9–3.7)
Glucose, Bld: 72 mg/dL (ref 65–139)
Potassium: 4.4 mmol/L (ref 3.5–5.3)
Sodium: 141 mmol/L (ref 135–146)
Total Bilirubin: 0.2 mg/dL (ref 0.2–1.2)
Total Protein: 6.8 g/dL (ref 6.1–8.1)

## 2020-11-07 LAB — TSH: TSH: 0.84 m[IU]/L

## 2021-06-11 ENCOUNTER — Ambulatory Visit (INDEPENDENT_AMBULATORY_CARE_PROVIDER_SITE_OTHER): Payer: Self-pay | Admitting: Sports Medicine

## 2021-06-11 ENCOUNTER — Other Ambulatory Visit: Payer: Self-pay

## 2021-06-11 ENCOUNTER — Ambulatory Visit (INDEPENDENT_AMBULATORY_CARE_PROVIDER_SITE_OTHER): Payer: Self-pay

## 2021-06-11 DIAGNOSIS — M5412 Radiculopathy, cervical region: Secondary | ICD-10-CM

## 2021-06-11 DIAGNOSIS — M542 Cervicalgia: Secondary | ICD-10-CM

## 2021-06-11 NOTE — Progress Notes (Signed)
    Procedures performed today:    None.  Independent interpretation of notes and tests performed by another provider:   None.  Brief History, Exam, Impression, and Recommendations:    Radiculitis of left cervical region This is a pleasant 25 year old female, recently she had an episode where she woke up with her left arm numb, with pain, numbness, tingling shooting down from her neck. She had done a heavy workout session the day before. She was seen in the ER, given naproxen and tramadol, sounds like symptoms have improved to some degree, she still has some pain in her neck. Paresthesias have gone away, no weakness, no constitutional symptoms. We discussed the pathophysiology of cervical DDD, we will do home physical therapy, I would like my onset of cervical spine x-rays. Return to see me in 6 weeks as needed.    ___________________________________________ Ihor Austin. Benjamin Stain, M.D., ABFM., CAQSM. Primary Care and Sports Medicine [facility]  Adjunct Instructor of Family Medicine  [employer]

## 2021-06-11 NOTE — Assessment & Plan Note (Signed)
This is a pleasant 25 year old female, recently she had an episode where she woke up with her left arm numb, with pain, numbness, tingling shooting down from her neck. She had done a heavy workout session the day before. She was seen in the ER, given naproxen and tramadol, sounds like symptoms have improved to some degree, she still has some pain in her neck. Paresthesias have gone away, no weakness, no constitutional symptoms. We discussed the pathophysiology of cervical DDD, we will do home physical therapy, I would like my onset of cervical spine x-rays. Return to see me in 6 weeks as needed.

## 2021-07-12 ENCOUNTER — Ambulatory Visit: Payer: Self-pay

## 2021-07-20 ENCOUNTER — Other Ambulatory Visit: Payer: Self-pay

## 2021-07-20 ENCOUNTER — Ambulatory Visit (INDEPENDENT_AMBULATORY_CARE_PROVIDER_SITE_OTHER): Payer: Self-pay | Admitting: Sports Medicine

## 2021-07-20 DIAGNOSIS — M5412 Radiculopathy, cervical region: Secondary | ICD-10-CM

## 2021-07-20 NOTE — Progress Notes (Signed)
    Procedures performed today:    None.  Independent interpretation of notes and tests performed by another provider:   None.  Brief History, Exam, Impression, and Recommendations:    Radiculitis of left cervical region Michele Ellison, she is a pleasant 25 year old female, she had an episode where she woke up and the left arm was numb with pain, numbness, tingling, shooting down from the neck, this occurred after a heavy workout session. She was seen in the emergency department and given naproxen, tramadol, physical therapy. Today she reports persistence of symptoms, and reports of bilateral numbness and tingling in both hands when she wakes up. Due to failure of conservative treatment we will proceed with cervical spine MRI, x-rays simply showed loss of normal lordosis. She tells me she did have a nerve conduction and EMG, I cannot find the results so she will try to obtain these for me, and if unable to obtain results we will get bilateral nerve conduction and EMG of the upper extremities. Return to see me when she gets the nerve conduction and EMG results. My concern is cervical radiculopathy versus carpal tunnel syndrome.    ___________________________________________ Ihor Austin. Benjamin Stain, M.D., ABFM., CAQSM. Primary Care and Sports Medicine [facility]  Adjunct Instructor of Family Medicine  [employer]

## 2021-07-20 NOTE — Assessment & Plan Note (Signed)
Michele Ellison returns, she is a pleasant 25 year old female, she had an episode where she woke up and the left arm was numb with pain, numbness, tingling, shooting down from the neck, this occurred after a heavy workout session. She was seen in the emergency department and given naproxen, tramadol, physical therapy. Today she reports persistence of symptoms, and reports of bilateral numbness and tingling in both hands when she wakes up. Due to failure of conservative treatment we will proceed with cervical spine MRI, x-rays simply showed loss of normal lordosis. She tells me she did have a nerve conduction and EMG, I cannot find the results so she will try to obtain these for me, and if unable to obtain results we will get bilateral nerve conduction and EMG of the upper extremities. Return to see me when she gets the nerve conduction and EMG results. My concern is cervical radiculopathy versus carpal tunnel syndrome.

## 2021-07-23 ENCOUNTER — Ambulatory Visit: Payer: Self-pay | Admitting: Sports Medicine

## 2021-08-09 ENCOUNTER — Other Ambulatory Visit: Payer: Self-pay

## 2021-08-23 ENCOUNTER — Other Ambulatory Visit: Payer: Self-pay

## 2021-09-19 ENCOUNTER — Encounter: Payer: Self-pay | Admitting: Sports Medicine

## 2021-09-20 ENCOUNTER — Ambulatory Visit (INDEPENDENT_AMBULATORY_CARE_PROVIDER_SITE_OTHER): Payer: Self-pay

## 2021-09-20 ENCOUNTER — Other Ambulatory Visit: Payer: Self-pay

## 2021-09-20 DIAGNOSIS — M5412 Radiculopathy, cervical region: Secondary | ICD-10-CM

## 2022-05-05 NOTE — Progress Notes (Signed)
Late arrival for appointment.  Appointment rescheduled.

## 2022-05-06 ENCOUNTER — Encounter: Payer: Medicaid Other | Admitting: Medical-Surgical

## 2022-05-06 ENCOUNTER — Encounter: Payer: Self-pay | Admitting: Medical-Surgical

## 2022-05-06 DIAGNOSIS — Z0000 Encounter for general adult medical examination without abnormal findings: Secondary | ICD-10-CM

## 2022-06-29 ENCOUNTER — Encounter: Payer: Self-pay | Admitting: Medical-Surgical

## 2022-06-29 ENCOUNTER — Ambulatory Visit (INDEPENDENT_AMBULATORY_CARE_PROVIDER_SITE_OTHER): Payer: Self-pay | Admitting: Medical-Surgical

## 2022-06-29 VITALS — BP 143/73 | HR 90 | Resp 20 | Ht 60.0 in | Wt 114.1 lb

## 2022-06-29 DIAGNOSIS — Z23 Encounter for immunization: Secondary | ICD-10-CM

## 2022-06-29 DIAGNOSIS — Z131 Encounter for screening for diabetes mellitus: Secondary | ICD-10-CM

## 2022-06-29 DIAGNOSIS — Z1329 Encounter for screening for other suspected endocrine disorder: Secondary | ICD-10-CM

## 2022-06-29 DIAGNOSIS — Z3200 Encounter for pregnancy test, result unknown: Secondary | ICD-10-CM

## 2022-06-29 DIAGNOSIS — Z0000 Encounter for general adult medical examination without abnormal findings: Secondary | ICD-10-CM

## 2022-06-29 LAB — POCT URINE PREGNANCY: Preg Test, Ur: NEGATIVE

## 2022-06-29 NOTE — Progress Notes (Signed)
Complete physical exam  Patient: Michele Ellison   DOB: May 05, 1996   26 y.o. Female  MRN: [id number]  Subjective:    Chief Complaint  Patient presents with   Annual Exam   Michele Ellison is a 26 y.o. female who presents today for a complete physical exam. She reports consuming a general diet. Home exercise routine includes a lot of walking. She generally feels well. She reports sleeping fairly well. She does have additional problems to discuss today.   She notes some concern over her weight. She has been eating larger portions and calorie dense foods but her weight does not seem to be increasing at all. She has not lost weight but is confused that she is not gaining either. Also notes that she stays very active on a daily basis.   She and her partner are doing IUI at home and they are on their second attempt. She is due to start her menses in a couple of days and has noted some lower abdominal cramping that is unusual. Would like a pregnancy test today.   Has a history of issues with club foot. Recently cut her foot on accident and was started on antibiotics. Feels that the wound has healed but there is a small flap of skin that is there and it looks like there is still a small hole under it.   Most recent fall risk assessment:    06/29/2022   10:28 AM  [place] in the past year? 1  Number falls in past yr: 0  Injury with Fall? 1  Comment SLIPPED AT WORK  Risk for fall due to : History of fall(s)  Follow up Falls evaluation completed    Most recent depression screenings:    06/29/2022   10:27 AM 11/06/2020    9:53 AM  PHQ 2/9 Scores  PHQ - 2 Score 1 2  PHQ- 9 Score  2    Vision:Not within last year , Dental: No current dental problems and No regular dental care , and STD: The patient denies history of sexually transmitted disease.    Patient Care Team: Samuel Bouche, NP as PCP - General (Nurse Practitioner)   Outpatient Medications Prior to Visit  Medication Sig    Prenatal Vit-Fe Fumarate-FA (PRENATAL MULTIVITAMIN) TABS tablet Take 1 tablet by mouth daily at 12 noon.   No facility-administered medications prior to visit.    Review of Systems  Constitutional:  Negative for chills, fever, malaise/fatigue and weight loss.  HENT:  Negative for congestion, ear pain, hearing loss, sinus pain and sore throat.   Eyes:  Negative for blurred vision, photophobia and pain.  Respiratory:  Negative for cough, shortness of breath and wheezing.   Cardiovascular:  Negative for chest pain, palpitations and leg swelling.  Gastrointestinal:  Negative for abdominal pain, constipation, diarrhea, heartburn, nausea and vomiting.  Genitourinary:  Negative for dysuria, frequency and urgency.  Musculoskeletal:  Negative for falls and neck pain.  Skin:  Negative for itching and rash.  Neurological:  Negative for dizziness, weakness and headaches.  Endo/Heme/Allergies:  Negative for polydipsia. Does not bruise/bleed easily.  Psychiatric/Behavioral:  Negative for depression, substance abuse and suicidal ideas. The patient is not nervous/anxious and does not have insomnia.       Objective:     BP (!) 143/73 (BP Location: Right Arm, Cuff Size: Normal)   Pulse 90   Resp 20   Ht 5' (1.524 m)   Wt 114 lb 1.6 oz (51.8  kg)   SpO2 100%   BMI 22.28 kg/m    Physical Exam Constitutional:      General: She is not in acute distress.    Appearance: Normal appearance. She is normal weight. She is not ill-appearing.  HENT:     Head: Normocephalic and atraumatic.     Right Ear: Tympanic membrane, ear canal and external ear normal. There is no impacted cerumen.     Left Ear: Tympanic membrane, ear canal and external ear normal. There is no impacted cerumen.     Nose: Nose normal.     Mouth/Throat:     Mouth: Mucous membranes are moist.     Pharynx: No oropharyngeal exudate or posterior oropharyngeal erythema.  Eyes:     General: No scleral icterus.       Right eye: No  discharge.        Left eye: No discharge.     Extraocular Movements: Extraocular movements intact.     Conjunctiva/sclera: Conjunctivae normal.     Pupils: Pupils are equal, round, and reactive to light.  Neck:     Thyroid: No thyromegaly.     Vascular: No carotid bruit or JVD.     Trachea: Trachea normal.  Cardiovascular:     Rate and Rhythm: Normal rate and regular rhythm.     Pulses: Normal pulses.     Heart sounds: Normal heart sounds. No murmur heard.    No friction rub. No gallop.  Pulmonary:     Effort: Pulmonary effort is normal. No respiratory distress.     Breath sounds: Normal breath sounds. No wheezing.  Abdominal:     General: Bowel sounds are normal. There is no distension.     Palpations: Abdomen is soft.     Tenderness: There is abdominal tenderness (generalized). There is no guarding.  Musculoskeletal:        General: Normal range of motion.     Cervical back: Normal range of motion and neck supple.  Skin:    General: Skin is warm and dry.  Neurological:     Mental Status: She is alert and oriented to person, place, and time.     Cranial Nerves: No cranial nerve deficit.  Psychiatric:        Mood and Affect: Mood normal.        Behavior: Behavior normal.        Thought Content: Thought content normal.        Judgment: Judgment normal.      Results for orders placed or performed in visit on 06/29/22  POCT urine pregnancy  Result Value Ref Range   Preg Test, Ur Negative Negative       Assessment & Plan:    Routine Health Maintenance and Physical Exam  Immunization History  Administered Date(s) Administered   DTaP 03/14/1996, 06/07/1996, 07/27/1996, 01/17/1997, 03/14/2000   HIB (PRP-OMP) 03/14/1996, 06/07/1996, 07/27/1996, 01/17/1997   HPV 9-valent 06/29/2022   HPV Quadrivalent 08/03/2013, 09/04/2013, 02/04/2014   Hepatitis A 08/03/2013, 02/04/2014   Hepatitis B 1996/07/04, 03/14/1996, 07/27/1996   IPV 03/14/1996, 06/07/1996, 01/17/1997,  03/14/2000   Influenza-Unspecified 08/03/2013   MMR 01/17/1997, 03/14/2000   Meningococcal Polysaccharide 08/03/2013   Td 07/15/2007   Tdap 07/15/2007   Varicella 03/14/2000, 08/03/2013    Health Maintenance  Topic Date Due   HIV Screening  Never done   Hepatitis C Screening  Never done   COVID-19 Vaccine (1) 07/15/2022 (Originally 07/17/1996)   INFLUENZA VACCINE  01/09/2023 (Originally 05/11/2022)   Samul Dada  06/30/2023 (Originally 07/14/2017)   PAP-Cervical Cytology Screening  04/04/2023   PAP SMEAR-Modifier  04/04/2023   HPV VACCINES  Completed    Discussed health benefits of physical activity, and encouraged her to engage in regular exercise appropriate for her age and condition.  1. Annual physical exam Checking labs as below. UTD on preventative care. Wellness info given with AVS.  - Lipid panel - COMPLETE METABOLIC PANEL WITH GFR - CBC with Differential/Platelet  2. Need for influenza vaccination Declined today.   3. Need for Tdap vaccination Declined today.   4. Thyroid disorder screen Checking TSH. - TSH  5. Diabetes mellitus screening Checking A1c.  - Hemoglobin A1c  6. Need for HPV vaccination Done today.  - HPV 9-valent vaccine,Recombinat  7. Encounter for pregnancy test, result unknown UPT negative.  - POCT urine pregnancy  Return in about 1 year (around 06/30/2023) for annual physical exam.   Samuel Bouche, NP

## 2022-06-30 LAB — LIPID PANEL
Cholesterol: 132 mg/dL (ref <200)
HDL: 68 mg/dL (ref >=50)
LDL Cholesterol (Calc): 49 mg/dL (calc)
Non-HDL Cholesterol (Calc): 64 mg/dL (calc) (ref <130)
Total CHOL/HDL Ratio: 1.9 (calc) (ref <5.0)
Triglycerides: 66 mg/dL (ref <150)

## 2022-06-30 LAB — CBC WITH DIFFERENTIAL/PLATELET
Absolute Monocytes: 413 cells/uL (ref 200–950)
Basophils Absolute: 32 cells/uL (ref 0–200)
Basophils Relative: 0.6 %
Eosinophils Absolute: 58 cells/uL (ref 15–500)
Eosinophils Relative: 1.1 %
HCT: 39.5 % (ref 35.0–45.0)
Hemoglobin: 12.7 g/dL (ref 11.7–15.5)
Lymphs Abs: 1526 cells/uL (ref 850–3900)
MCH: 29.5 pg (ref 27.0–33.0)
MCHC: 32.2 g/dL (ref 32.0–36.0)
MCV: 91.9 fL (ref 80.0–100.0)
MPV: 11.3 fL (ref 7.5–12.5)
Monocytes Relative: 7.8 %
Neutro Abs: 3270 cells/uL (ref 1500–7800)
Neutrophils Relative %: 61.7 %
Platelets: 158 10*3/uL (ref 140–400)
RBC: 4.3 10*6/uL (ref 3.80–5.10)
RDW: 12.2 % (ref 11.0–15.0)
Total Lymphocyte: 28.8 %
WBC: 5.3 10*3/uL (ref 3.8–10.8)

## 2022-06-30 LAB — HEMOGLOBIN A1C
Hgb A1c MFr Bld: 5.1 % of total Hgb (ref <5.7)
Mean Plasma Glucose: 100 mg/dL
eAG (mmol/L): 5.5 mmol/L

## 2022-06-30 LAB — COMPLETE METABOLIC PANEL WITH GFR
AG Ratio: 1.6 (calc) (ref 1.0–2.5)
ALT: 11 U/L (ref 6–29)
AST: 15 U/L (ref 10–30)
Albumin: 4.7 g/dL (ref 3.6–5.1)
Alkaline phosphatase (APISO): 37 U/L (ref 31–125)
BUN: 13 mg/dL (ref 7–25)
CO2: 25 mmol/L (ref 20–32)
Calcium: 9.3 mg/dL (ref 8.6–10.2)
Chloride: 105 mmol/L (ref 98–110)
Creat: 0.61 mg/dL (ref 0.50–0.96)
Globulin: 2.9 g/dL (calc) (ref 1.9–3.7)
Glucose, Bld: 75 mg/dL (ref 65–139)
Potassium: 3.7 mmol/L (ref 3.5–5.3)
Sodium: 140 mmol/L (ref 135–146)
Total Bilirubin: 0.3 mg/dL (ref 0.2–1.2)
Total Protein: 7.6 g/dL (ref 6.1–8.1)
eGFR: 126 mL/min/{1.73_m2} (ref >=60)

## 2022-06-30 LAB — TSH: TSH: 1.11 mIU/L

## 2022-07-27 ENCOUNTER — Ambulatory Visit (INDEPENDENT_AMBULATORY_CARE_PROVIDER_SITE_OTHER): Payer: Self-pay | Admitting: Sports Medicine

## 2022-07-27 ENCOUNTER — Ambulatory Visit (INDEPENDENT_AMBULATORY_CARE_PROVIDER_SITE_OTHER): Payer: Self-pay

## 2022-07-27 ENCOUNTER — Encounter: Payer: Self-pay | Admitting: Sports Medicine

## 2022-07-27 DIAGNOSIS — M79641 Pain in right hand: Secondary | ICD-10-CM

## 2022-07-27 DIAGNOSIS — M2550 Pain in unspecified joint: Secondary | ICD-10-CM

## 2022-07-27 MED ORDER — MELOXICAM 15 MG PO TABS: ORAL_TABLET | ORAL | 3 refills | Status: DC

## 2022-07-27 NOTE — Assessment & Plan Note (Signed)
Full rheumatoid work-up as below.

## 2022-07-27 NOTE — Assessment & Plan Note (Signed)
Pleasant 26 year old female, she had several weeks of increasing pain right hand that she localizes predominantly at the thumb basal joint. She did have some swelling. On exam she has a visibly swollen thenar eminence, pain on palpation of the thumb basal joint and a very minimally positive Finkelstein sign. I think the dominant issue is thumb basal joint synovitis. I would like some updated x-rays of her right hand, we will add meloxicam for 2 weeks at which point she will stop. Home physical therapy. Considering her age and the swelling in her hand I would also like a rheumatoid work-up. Return to see me in 6 weeks and if not better we will consider either thumb basal joint injection or a first extensor compartment injection depending on which structure has declared itself as the primary pain generator.

## 2022-07-27 NOTE — Progress Notes (Signed)
    Procedures performed today:    None.  Independent interpretation of notes and tests performed by another provider:   None.  Brief History, Exam, Impression, and Recommendations:    Right hand pain Pleasant 26 year old female, she had several weeks of increasing pain right hand that she localizes predominantly at the thumb basal joint. She did have some swelling. On exam she has a visibly swollen thenar eminence, pain on palpation of the thumb basal joint and a very minimally positive Finkelstein sign. I think the dominant issue is thumb basal joint synovitis. I would like some updated x-rays of her right hand, we will add meloxicam for 2 weeks at which point she will stop. Home physical therapy. Considering her age and the swelling in her hand I would also like a rheumatoid work-up. Return to see me in 6 weeks and if not better we will consider either thumb basal joint injection or a first extensor compartment injection depending on which structure has declared itself as the primary pain generator.  Polyarthralgia Full rheumatoid work-up as below.  Chronic process with exacerbation and pharmacologic intervention  ____________________________________________ Gwen Her. Dianah Field, M.D., ABFM., CAQSM., AME. Primary Care and Sports Medicine [facility]  Adjunct Professor of [place] [employer]  Risk manager

## 2022-07-29 LAB — CBC WITH DIFFERENTIAL/PLATELET
Absolute Monocytes: 332 cells/uL (ref 200–950)
Basophils Absolute: 29 cells/uL (ref 0–200)
Basophils Relative: 0.7 %
Eosinophils Absolute: 130 cells/uL (ref 15–500)
Eosinophils Relative: 3.1 %
HCT: 36.9 % (ref 35.0–45.0)
Hemoglobin: 12.2 g/dL (ref 11.7–15.5)
Lymphs Abs: 1218 cells/uL (ref 850–3900)
MCH: 30.1 pg (ref 27.0–33.0)
MCHC: 33.1 g/dL (ref 32.0–36.0)
MCV: 91.1 fL (ref 80.0–100.0)
MPV: 11.5 fL (ref 7.5–12.5)
Monocytes Relative: 7.9 %
Neutro Abs: 2491 cells/uL (ref 1500–7800)
Neutrophils Relative %: 59.3 %
Platelets: 164 10*3/uL (ref 140–400)
RBC: 4.05 10*6/uL (ref 3.80–5.10)
RDW: 12 % (ref 11.0–15.0)
Total Lymphocyte: 29 %
WBC: 4.2 10*3/uL (ref 3.8–10.8)

## 2022-07-29 LAB — LUPUS(12) PANEL
Anti Nuclear Antibody (ANA): POSITIVE — AB
C3 Complement: 132 mg/dL (ref 83–193)
C4 Complement: 42 mg/dL (ref 15–57)
ENA SM Ab Ser-aCnc: <1 AI
Rheumatoid fact SerPl-aCnc: <14 IU/mL (ref <14)
Ribosomal P Protein Ab: <1 AI
SM/RNP: <1 AI
SSA (Ro) (ENA) Antibody, IgG: <1 AI
SSB (La) (ENA) Antibody, IgG: <1 AI
Scleroderma (Scl-70) (ENA) Antibody, IgG: <1 AI
Thyroperoxidase Ab SerPl-aCnc: 1 IU/mL (ref <9)
ds DNA Ab: <1 IU/mL

## 2022-07-29 LAB — COMPREHENSIVE METABOLIC PANEL
AG Ratio: 1.6 (calc) (ref 1.0–2.5)
ALT: 10 U/L (ref 6–29)
AST: 16 U/L (ref 10–30)
Albumin: 4.3 g/dL (ref 3.6–5.1)
Alkaline phosphatase (APISO): 35 U/L (ref 31–125)
BUN: 7 mg/dL (ref 7–25)
CO2: 28 mmol/L (ref 20–32)
Calcium: 8.9 mg/dL (ref 8.6–10.2)
Chloride: 106 mmol/L (ref 98–110)
Creat: 0.66 mg/dL (ref 0.50–0.96)
Globulin: 2.7 g/dL (calc) (ref 1.9–3.7)
Glucose, Bld: 88 mg/dL (ref 65–99)
Potassium: 4.3 mmol/L (ref 3.5–5.3)
Sodium: 141 mmol/L (ref 135–146)
Total Bilirubin: 0.5 mg/dL (ref 0.2–1.2)
Total Protein: 7 g/dL (ref 6.1–8.1)

## 2022-07-29 LAB — URIC ACID: Uric Acid, Serum: 3.3 mg/dL (ref 2.5–7.0)

## 2022-07-29 LAB — ANTI-NUCLEAR AB-TITER (ANA TITER)
ANA TITER: 1:80 {titer} — ABNORMAL HIGH
ANA Titer 1: 1:320 {titer} — ABNORMAL HIGH

## 2022-07-29 LAB — RHEUMATOID FACTOR (IGA, IGG, IGM)
Rheumatoid Factor (IgA): <5 U (ref <=6)
Rheumatoid Factor (IgG): <5 U (ref <=6)
Rheumatoid Factor (IgM): <5 U (ref <=6)

## 2022-07-29 LAB — SEDIMENTATION RATE: Sed Rate: 6 mm/h (ref 0–20)

## 2022-07-29 LAB — CK: Total CK: 151 U/L — ABNORMAL HIGH (ref 29–143)

## 2022-07-29 LAB — CYCLIC CITRUL PEPTIDE ANTIBODY, IGG: Cyclic Citrullin Peptide Ab: <16 UNITS

## 2022-09-07 ENCOUNTER — Ambulatory Visit: Payer: Medicaid Other | Admitting: Sports Medicine

## 2023-04-10 ENCOUNTER — Ambulatory Visit
Admission: EM | Admit: 2023-04-10 | Discharge: 2023-04-10 | Disposition: A | Discharge status: Home / Self Care | Payer: Medicaid Other | Attending: Family Medicine | Admitting: Family Medicine

## 2023-04-10 ENCOUNTER — Other Ambulatory Visit: Payer: Self-pay

## 2023-04-10 DIAGNOSIS — L03113 Cellulitis of right upper limb: Secondary | ICD-10-CM | POA: Diagnosis not present

## 2023-04-10 DIAGNOSIS — T63441A Toxic effect of venom of bees, accidental (unintentional), initial encounter: Secondary | ICD-10-CM | POA: Diagnosis not present

## 2023-04-10 DIAGNOSIS — M7989 Other specified soft tissue disorders: Secondary | ICD-10-CM | POA: Diagnosis not present

## 2023-04-10 MED ORDER — CEPHALEXIN 500 MG PO CAPS
1000.0000 mg | ORAL_CAPSULE | Freq: Two times a day (BID) | ORAL | 0 refills | Status: AC
Start: 2023-04-10 — End: 2023-04-17

## 2023-04-10 MED ORDER — PREDNISONE 10 MG (21) PO TBPK: ORAL_TABLET | Freq: Every day | ORAL | 0 refills | Status: DC

## 2023-04-10 NOTE — ED Triage Notes (Addendum)
Pt presents to uc with co of yellow jacket sting to right hand and left neck yesterday morning while at a track meet. Pt has iced it and soaked it in epsom salt and hydrocortisone cream. Pain and swelling is worse in right hand and is red and hot.

## 2023-04-10 NOTE — Discharge Instructions (Addendum)
Advised patient to take medications as directed with food to completion.  Advised patient to take prednisone with first dose of Keflex for the next 7 of 10 days.  Advised patient may RICE affected area of right hand for 30 minutes 3 times daily for the next 3 days.  Encouraged increase daily water intake to 64 ounces per day while taking these medications.  Advised if symptoms worsen and/or unresolved please follow-up PCP or here for further evaluation.

## 2023-04-10 NOTE — ED Provider Notes (Signed)
[facility]    CSN: [id number] Arrival date & time: 04/10/23  1329      History   Chief Complaint Chief Complaint  Patient presents with   Insect Bite    HPI Michele Ellison is a 27 y.o. female.   HPI 27 year old female presents with wasp sting to right hand and left neck area that occurred yesterday morning around 1030.  PMH significant for A-fib and migraine.  Patient is accompanied by her mother this afternoon.  Past Medical History:  Diagnosis Date   Arrhythmogenic right ventricular cardiomyopathy (HCC)    Atrial fibrillation (HCC)    Migraine    Sprain of ulnar collateral ligament of metacarpophalangeal (MCP) joint of right thumb 07/03/2019    Patient Active Problem List   Diagnosis Date Noted   Polyarthralgia 07/27/2022   Radiculitis of left cervical region 06/11/2021   Right hand pain 10/30/2019   Intractable migraine with aura without status migrainosus 09/30/2017    Past Surgical History:  Procedure Laterality Date   CLUB FOOT RELEASE     WISDOM TOOTH EXTRACTION     WISDOM TOOTH EXTRACTION      OB History   No obstetric history on file.      Home Medications    Prior to Admission medications   Medication Sig Start Date End Date Taking? Authorizing Provider  cephALEXin (KEFLEX) 500 MG capsule Take 2 capsules (1,000 mg total) by mouth 2 (two) times daily for 7 days. 04/10/23 04/17/23 Yes Trevor Iha, FNP  predniSONE (STERAPRED UNI-PAK 21 TAB) 10 MG (21) TBPK tablet Take by mouth daily. Take 6 tabs by mouth daily  for 2 days, then 5 tabs for 2 days, then 4 tabs for 2 days, then 3 tabs for 2 days, 2 tabs for 2 days, then 1 tab by mouth daily for 2 days 04/10/23  Yes Trevor Iha, FNP  Prenatal Vit-Fe Fumarate-FA (PRENATAL MULTIVITAMIN) TABS tablet Take 1 tablet by mouth daily at 12 noon.    [provider]    Family History Family History  Problem Relation Age of Onset   Hypertension Father    Diabetes Paternal Grandmother     Diabetes Paternal Grandfather    Hypertension Mother     Social History Social History   Tobacco Use   Smoking status: Never   Smokeless tobacco: Never  Vaping Use   Vaping Use: Never used  Substance Use Topics   Alcohol use: Yes    Comment: Rarely   Drug use: Never     Allergies   Oxycodone   Review of Systems Review of Systems  Skin:  Positive for rash.       Wasp sting neck and right hand     Physical Exam Triage Vital Signs ED Triage Vitals [04/10/23 1435]  Enc Vitals Group     BP (!) 143/81     Pulse Rate 63     Resp 16     Temp 97.6 F (36.4 C)     Temp src      SpO2 98 %     Weight      Height      Head Circumference      Peak Flow      Pain Score 10     Pain Loc      Pain Edu?      Excl. in GC?    No data found.  Updated Vital Signs BP (!) 143/81   Pulse 63   Temp 97.6  F (36.4 C)   Resp 16   LMP 04/10/2023 (Exact Date)   SpO2 98%      Physical Exam Vitals and nursing note reviewed.  Constitutional:      Appearance: Normal appearance. She is normal weight.  HENT:     Head: Normocephalic and atraumatic.     Mouth/Throat:     Mouth: Mucous membranes are moist.     Pharynx: Oropharynx is clear.  Eyes:     Extraocular Movements: Extraocular movements intact.     Conjunctiva/sclera: Conjunctivae normal.     Pupils: Pupils are equal, round, and reactive to light.  Cardiovascular:     Rate and Rhythm: Normal rate and regular rhythm.     Pulses: Normal pulses.     Heart sounds: Normal heart sounds.  Pulmonary:     Effort: Pulmonary effort is normal.     Breath sounds: Normal breath sounds. No wheezing, rhonchi or rales.  Abdominal:     General: Bowel sounds are normal.  Musculoskeletal:        General: Normal range of motion.     Cervical back: Normal range of motion and neck supple.  Skin:    General: Skin is warm and dry.     Comments: Right hand: Marked soft tissue swelling noted; left side of neck (inferior aspects): Target  loss clean with erythematous base noted-please see images below  Neurological:     General: No focal deficit present.     Mental Status: She is alert and oriented to person, place, and time. Mental status is at baseline.  Psychiatric:        Mood and Affect: Mood normal.        Behavior: Behavior normal.         UC Treatments / Results  Labs (all labs ordered are listed, but only abnormal results are displayed) Labs Reviewed - No data to display  EKG   Radiology No results found.  Procedures Procedures (including critical care time)  Medications Ordered in UC Medications - No data to display  Initial Impression / Assessment and Plan / UC Course  I have reviewed the triage vital signs and the nursing notes.  Pertinent labs & imaging results that were available during my care of the patient were reviewed by me and considered in my medical decision making (see chart for details).     MDM: 1.  Cellulitis of right hand-Rx Keflex 1 g twice daily x 7 days; 2.  Bee sting reaction, accidental, or unintentional, initial encounter-Rx'd Sterapred Unipak (tapering from 60 mg to 10 mg over 10 days).;  3.  Swelling of right hand-Advised patient may RICE affected area of right hand for 30 minutes 3 times daily for the next 3 days.  Advised patient to take medications as directed with food to completion.  Advised patient to take prednisone with first dose of Keflex for the next 7 of 10 days.  Advised patient may RICE affected area of right hand for 30 minutes 3 times daily for the next 3 days.  Encouraged increase daily water intake to 64 ounces per day while taking these medications.  Advised if symptoms worsen and/or unresolved please follow-up PCP or here for further evaluation.  Patient discharged home, hemodynamically stable.  Work note provided to patient prior to discharge. Final Clinical Impressions(s) / UC Diagnoses   Final diagnoses:  Cellulitis of right hand  Bee sting reaction,  accidental or unintentional, initial encounter  Swelling of right hand  Discharge Instructions      Advised patient to take medications as directed with food to completion.  Advised patient to take prednisone with first dose of Keflex for the next 7 of 10 days.  Advised patient may RICE affected area of right hand for 30 minutes 3 times daily for the next 3 days.  Encouraged increase daily water intake to 64 ounces per day while taking these medications.  Advised if symptoms worsen and/or unresolved please follow-up PCP or here for further evaluation.     ED Prescriptions     Medication Sig Dispense Auth. Provider   cephALEXin (KEFLEX) 500 MG capsule Take 2 capsules (1,000 mg total) by mouth 2 (two) times daily for 7 days. 28 capsule Trevor Iha, FNP   predniSONE (STERAPRED UNI-PAK 21 TAB) 10 MG (21) TBPK tablet Take by mouth daily. Take 6 tabs by mouth daily  for 2 days, then 5 tabs for 2 days, then 4 tabs for 2 days, then 3 tabs for 2 days, 2 tabs for 2 days, then 1 tab by mouth daily for 2 days 42 tablet Trevor Iha, FNP      PDMP not reviewed this encounter.   Trevor Iha, FNP 04/10/23 1528

## 2023-05-25 ENCOUNTER — Other Ambulatory Visit: Payer: Self-pay

## 2023-08-17 IMAGING — DX DG CERVICAL SPINE COMPLETE 4+V
6 series · 6 of 6 positions shown · non-contrast
Comparison: None.

CLINICAL DATA: Neck pain radiating into the left shoulder

EXAM:
CERVICAL SPINE - COMPLETE 4+ VIEW

[c-spine obl (1 of 2)]
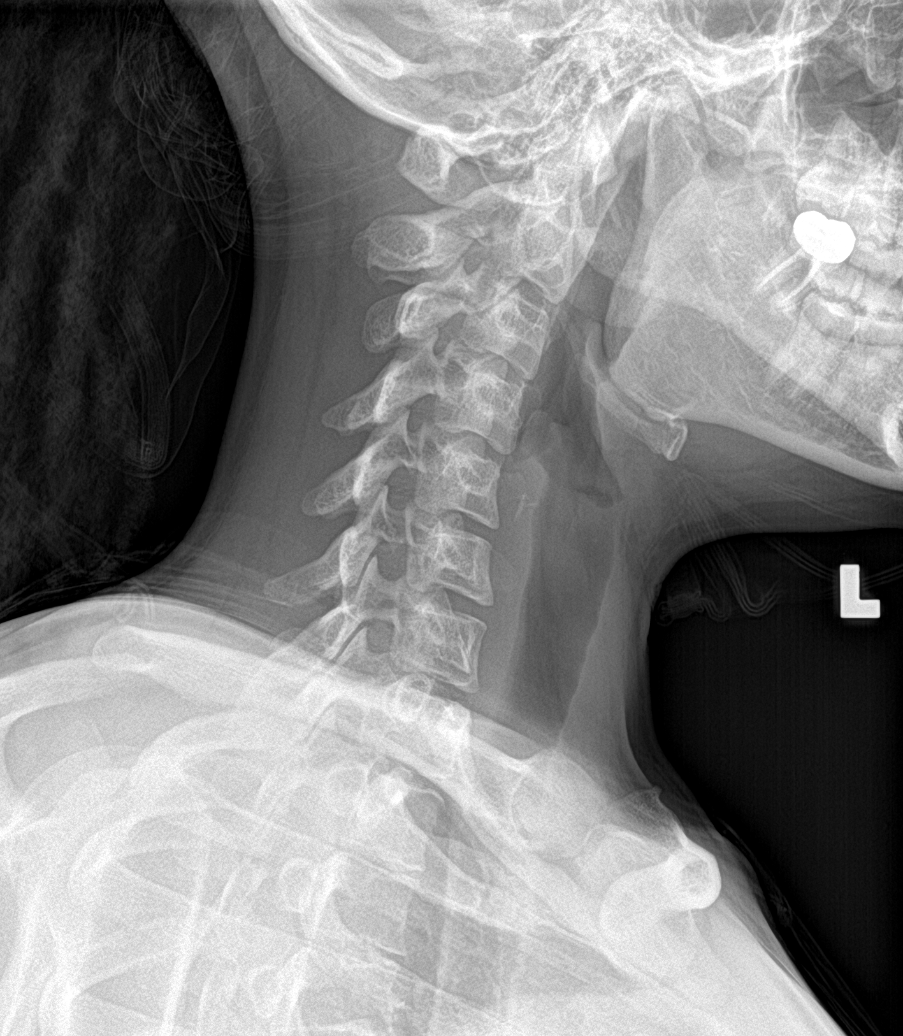

[c-spine obl (2 of 2)]
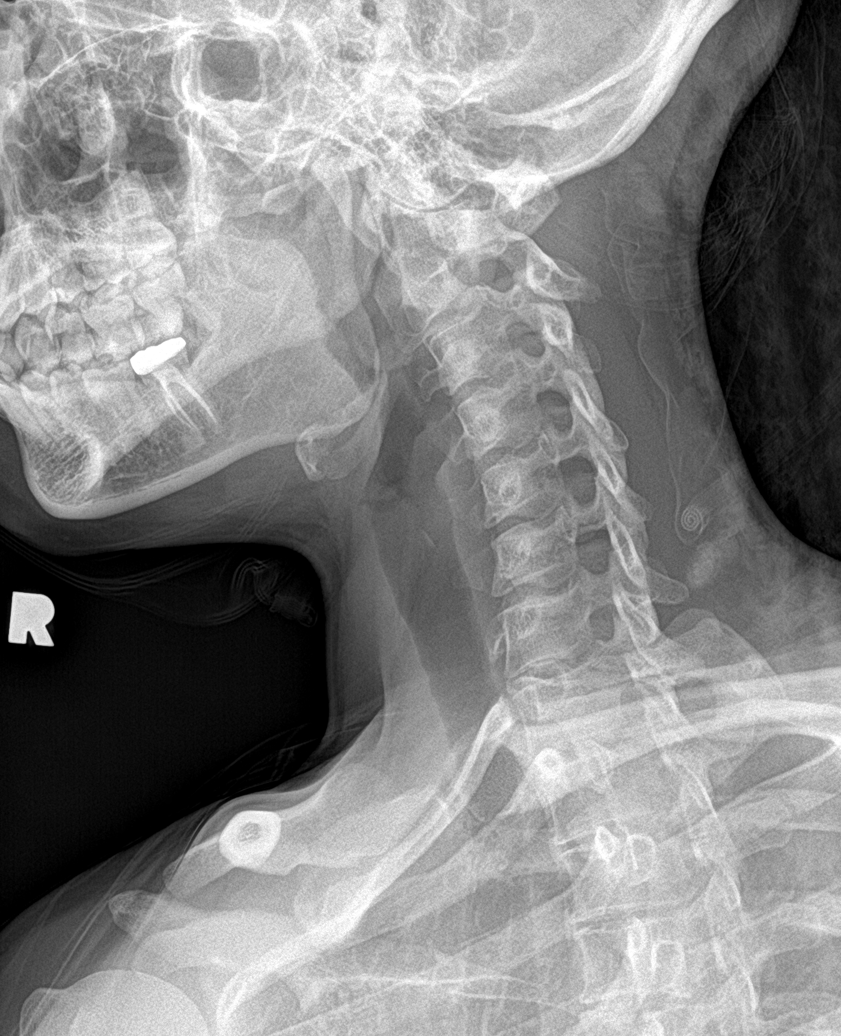

[c-spine ap]
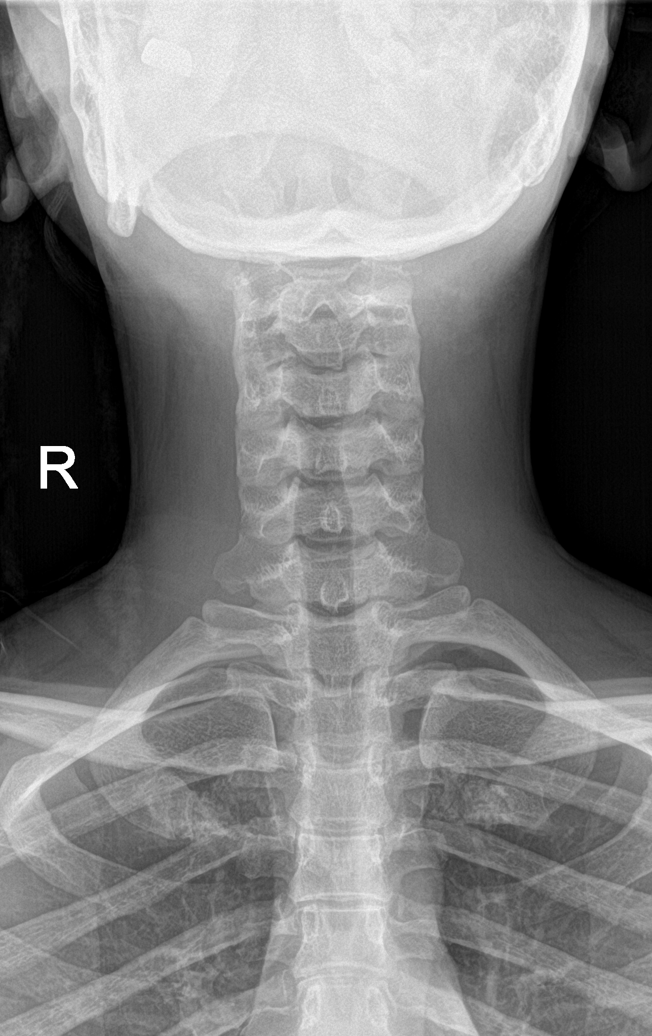

[c-spine open mouth]
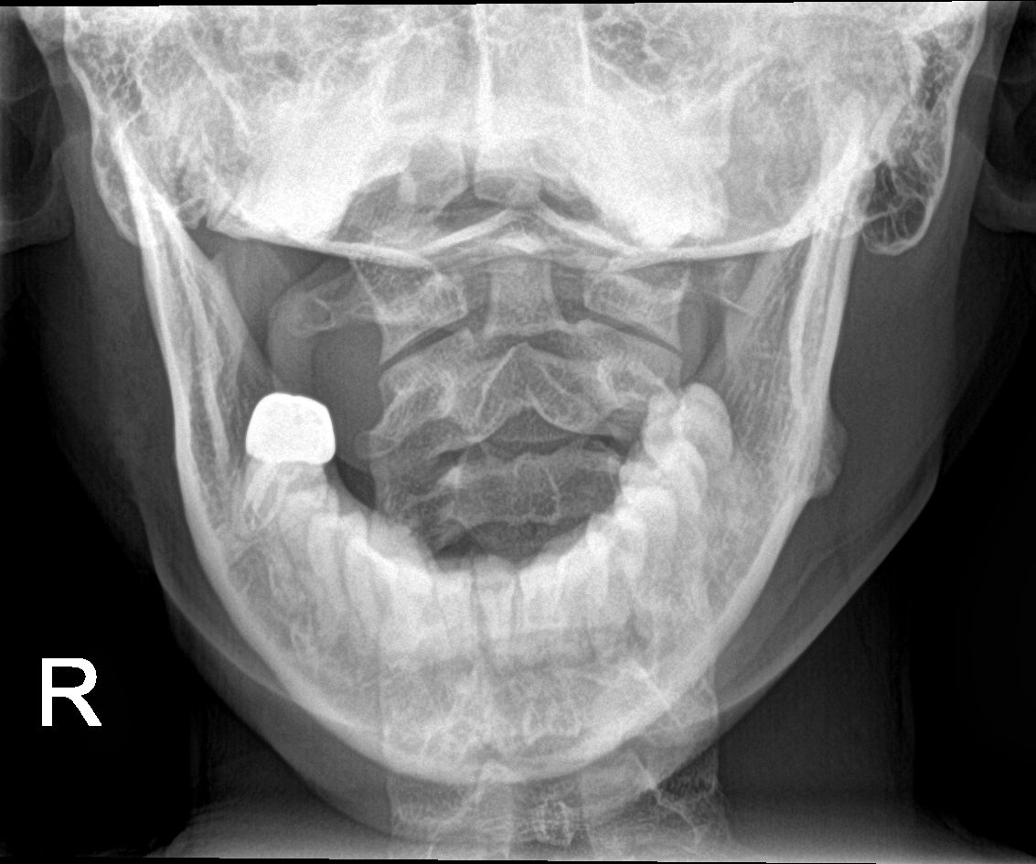

[c-spine swimmers]
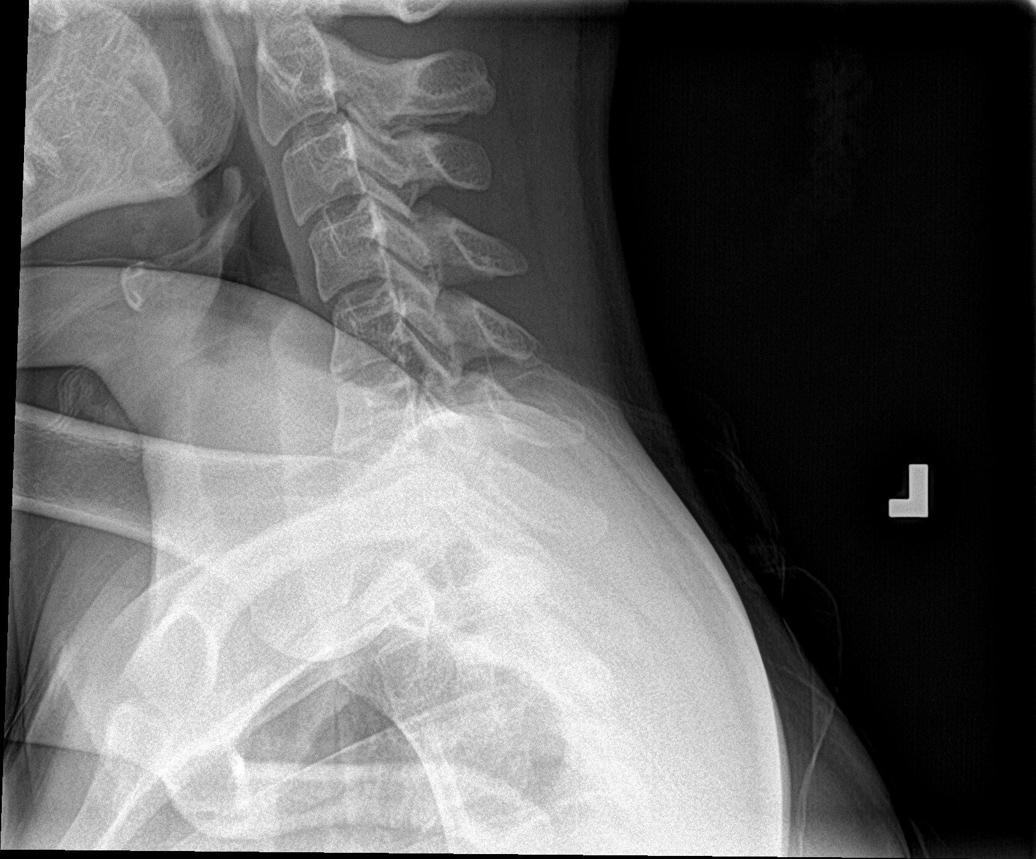

[c-spine lat]
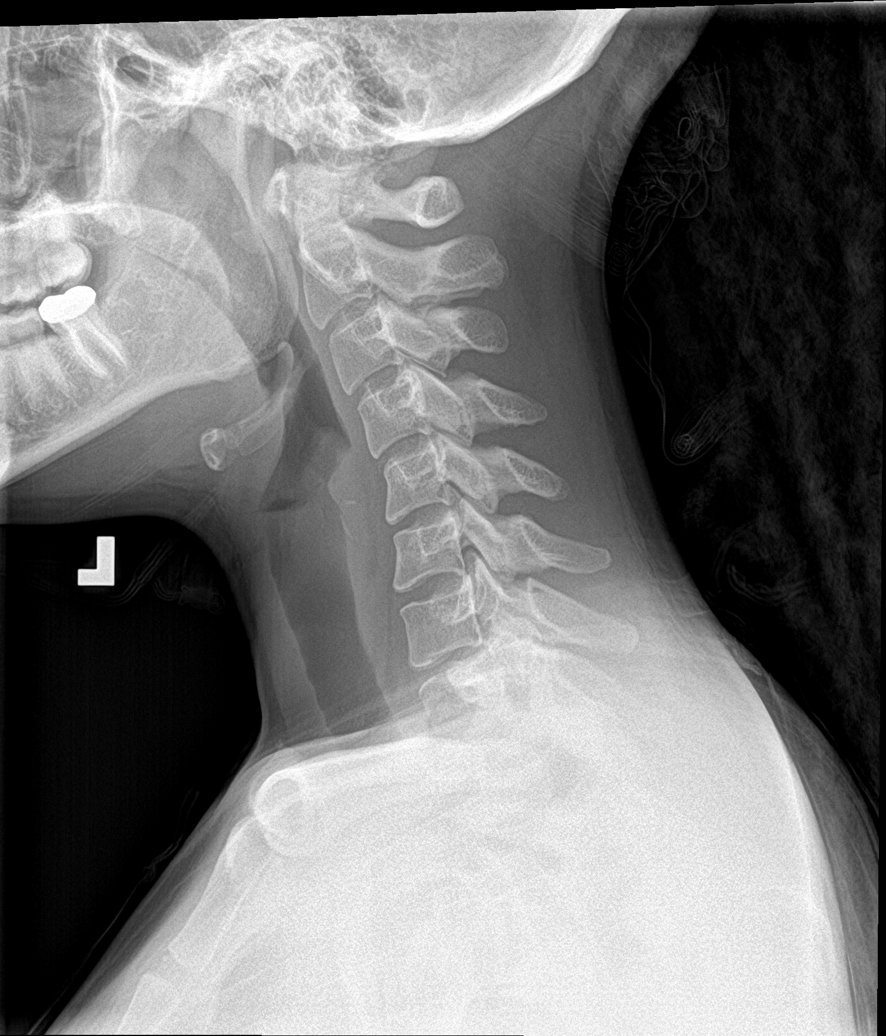

[6 of 6 positions shown; findings below may reference images not displayed]

FINDINGS: Reversal of the normal cervical lordosis. No evidence of acute
fracture or malalignment. No significant degenerative changes. The
upper thorax is unremarkable.
IMPRESSION: 1. Reversal of the normal cervical lordosis may be secondary to
underlying muscle spasm.
2. No acute fracture or malalignment.

## 2024-06-12 ENCOUNTER — Encounter: Payer: Self-pay | Admitting: Sports Medicine

## 2024-07-02 ENCOUNTER — Ambulatory Visit

## 2024-07-02 ENCOUNTER — Ambulatory Visit
Admission: RE | Admit: 2024-07-02 | Discharge: 2024-07-02 | Disposition: A | Discharge status: Home / Self Care | Attending: Family Medicine | Admitting: Family Medicine

## 2024-07-02 ENCOUNTER — Ambulatory Visit (INDEPENDENT_AMBULATORY_CARE_PROVIDER_SITE_OTHER)

## 2024-07-02 VITALS — BP 122/77 | HR 66 | Temp 98.7°F | Resp 17

## 2024-07-02 DIAGNOSIS — M25571 Pain in right ankle and joints of right foot: Secondary | ICD-10-CM

## 2024-07-02 DIAGNOSIS — S92064A Nondisplaced intraarticular fracture of right calcaneus, initial encounter for closed fracture: Secondary | ICD-10-CM

## 2024-07-02 DIAGNOSIS — Y9302 Activity, running: Secondary | ICD-10-CM | POA: Diagnosis not present

## 2024-07-02 MED ORDER — TRAMADOL HCL 50 MG PO TABS: 50.0000 mg | ORAL_TABLET | Freq: Four times a day (QID) | ORAL | 0 refills | Status: AC | PRN

## 2024-07-02 NOTE — ED Provider Notes (Signed)
 [facility]    CSN: [id number] Arrival date & time: 07/02/24  1107      History   Chief Complaint Chief Complaint  Patient presents with   Foot Injury    RT   Ankle Pain    RT    HPI Michele Ellison is a 28 y.o. female.   HPI Patient states that she decided to start trying to get in better physical condition.  She started yesterday.  She stepped in a hole and twisted her foot and fell.  Now she can hardly put weight on her right foot. Patient was born with a clubfoot and this foot has been operated upon in the past. Past Medical History:  Diagnosis Date   Arrhythmogenic right ventricular cardiomyopathy (HCC)    Atrial fibrillation (HCC)    Migraine    Sprain of ulnar collateral ligament of metacarpophalangeal (MCP) joint of right thumb 07/03/2019    Patient Active Problem List   Diagnosis Date Noted   Polyarthralgia 07/27/2022   Radiculitis of left cervical region 06/11/2021   Right hand pain 10/30/2019   Intractable migraine with aura without status migrainosus 09/30/2017   Right ankle pain 05/27/2015    Past Surgical History:  Procedure Laterality Date   CLUB FOOT RELEASE     WISDOM TOOTH EXTRACTION     WISDOM TOOTH EXTRACTION      OB History   No obstetric history on file.      Home Medications    Prior to Admission medications   Medication Sig Start Date End Date Taking? Authorizing Provider  traMADol  (ULTRAM ) 50 MG tablet Take 1 tablet (50 mg total) by mouth every 6 (six) hours as needed. 07/02/24  Yes Maranda Jamee Jacob, MD  polyethylene glycol (MIRALAX  / GLYCOLAX ) 17 g packet Take 17g or 1 capful daily can increase or decrease dose by half a capful daily to achieve 1-2 soft bowel movements daily 10/08/20   [provider]  Prenatal Vit-Fe Fumarate-FA (PRENATAL MULTIVITAMIN) TABS tablet Take 1 tablet by mouth daily at 12 noon.    [provider]    Family History Family History  Problem Relation Age of Onset    Hypertension Father    Diabetes Paternal Grandmother    Diabetes Paternal Grandfather    Hypertension Mother     Social History Social History   Tobacco Use   Smoking status: Never   Smokeless tobacco: Never  Vaping Use   Vaping status: Never Used  Substance Use Topics   Alcohol use: Yes    Comment: Rarely   Drug use: Never     Allergies   Oxycodone   Review of Systems Review of Systems See HPI  Physical Exam Triage Vital Signs ED Triage Vitals  Encounter Vitals Group     BP 07/02/24 1115 122/77     Girls Systolic BP Percentile --      Girls Diastolic BP Percentile --      Boys Systolic BP Percentile --      Boys Diastolic BP Percentile --      Pulse Rate 07/02/24 1115 66     Resp 07/02/24 1115 17     Temp 07/02/24 1115 98.7 F (37.1 C)     Temp Source 07/02/24 1115 Oral     SpO2 07/02/24 1115 100 %     Weight --      Height --      Head Circumference --      Peak Flow --  Pain Score 07/02/24 1311 9     Pain Loc --      Pain Education --      Exclude from Growth Chart --    No data found.  Updated Vital Signs BP 122/77 (BP Location: Right Arm)   Pulse 66   Temp 98.7 F (37.1 C) (Oral)   Resp 17   LMP 06/18/2024 (Approximate)   SpO2 100%      Physical Exam Constitutional:      General: She is not in acute distress.    Appearance: She is well-developed.  HENT:     Head: Normocephalic and atraumatic.  Eyes:     Conjunctiva/sclera: Conjunctivae normal.     Pupils: Pupils are equal, round, and reactive to light.  Cardiovascular:     Rate and Rhythm: Normal rate.  Pulmonary:     Effort: Pulmonary effort is normal. No respiratory distress.  Abdominal:     General: There is no distension.     Palpations: Abdomen is soft.  Musculoskeletal:        General: Swelling, tenderness and signs of injury present. Normal range of motion.     Cervical back: Normal range of motion.       Feet:  Skin:    General: Skin is warm and dry.   Neurological:     Mental Status: She is alert.     Gait: Gait abnormal.      UC Treatments / Results  Labs (all labs ordered are listed, but only abnormal results are displayed) Labs Reviewed - No data to display  EKG   Radiology DG Foot Complete Right Result Date: 07/02/2024 CLINICAL DATA:  Injury while running yesterday, right lateral malleolar pain EXAM: RIGHT FOOT COMPLETE - 3+ VIEW; RIGHT ANKLE - COMPLETE 3+ VIEW COMPARISON:  None Available. FINDINGS: Right ankle No acute fracture or dislocation. The talar dome is intact. No widening of the ankle mortise. Degenerative spurring along the lateral subtalar joint. Mild soft tissue swelling about the ankle. Right foot Subtle lucency through the anterior process of the calcaneus, only visualized on the frontal radiograph. There is no evidence of arthropathy or other focal bone abnormality. Soft tissues are unremarkable. IMPRESSION: 1. Nondisplaced fracture of the anterior process of the calcaneus. 2. Soft tissue swelling about the dorsum of the foot and about the ankle. Otherwise, no acute fracture or dislocation of the ankle. Electronically Signed   By: Rogelia Myers M.D.   On: 07/02/2024 12:30   DG Ankle Complete Right Result Date: 07/02/2024 CLINICAL DATA:  Injury while running yesterday, right lateral malleolar pain EXAM: RIGHT FOOT COMPLETE - 3+ VIEW; RIGHT ANKLE - COMPLETE 3+ VIEW COMPARISON:  None Available. FINDINGS: Right ankle No acute fracture or dislocation. The talar dome is intact. No widening of the ankle mortise. Degenerative spurring along the lateral subtalar joint. Mild soft tissue swelling about the ankle. Right foot Subtle lucency through the anterior process of the calcaneus, only visualized on the frontal radiograph. There is no evidence of arthropathy or other focal bone abnormality. Soft tissues are unremarkable. IMPRESSION: 1. Nondisplaced fracture of the anterior process of the calcaneus. 2. Soft tissue swelling  about the dorsum of the foot and about the ankle. Otherwise, no acute fracture or dislocation of the ankle. Electronically Signed   By: Rogelia Myers M.D.   On: 07/02/2024 12:30    Procedures Procedures (including critical care time)  Medications Ordered in UC Medications - No data to display  Initial Impression / Assessment  and Plan / UC Course  I have reviewed the triage vital signs and the nursing notes.  Pertinent labs & imaging results that were available during my care of the patient were reviewed by me and considered in my medical decision making (see chart for details).     Final Clinical Impressions(s) / UC Diagnoses   Final diagnoses:  Nondisplaced intraarticular fracture of right calcaneus, initial encounter for closed fracture     Discharge Instructions      Limit walking on injured foot Use ice and elevate to reduce pain Take tylenol for moderate pain Take tramadol  if pain severe Do not drive on tramadol  See orthopedic in follow up   ED Prescriptions     Medication Sig Dispense Auth. Provider   traMADol  (ULTRAM ) 50 MG tablet Take 1 tablet (50 mg total) by mouth every 6 (six) hours as needed. 15 tablet Maranda Jamee Jacob, MD      I have reviewed the PDMP during this encounter.   Maranda Jamee Jacob, MD 07/02/24 [phone]

## 2024-07-02 NOTE — ED Triage Notes (Signed)
 Pt c/o RT foot injury since yesterday . Say she was running when she stepped into a hole rolling ankle and foot. Taking tylenol and epsom salt soaks prn.

## 2024-07-02 NOTE — Discharge Instructions (Signed)
 Limit walking on injured foot Use ice and elevate to reduce pain Take tylenol for moderate pain Take tramadol  if pain severe Do not drive on tramadol  See orthopedic in follow up

## 2024-08-02 ENCOUNTER — Ambulatory Visit: Payer: Self-pay

## 2024-08-02 NOTE — Telephone Encounter (Signed)
 FYI Only or Action Required?: FYI only for provider.  Patient was last seen in primary care on 07/27/2022 by Curtis Debby PARAS, MD.  Called Nurse Triage reporting Foot Injury.  Symptoms began about a month ago.  Interventions attempted: Other: Seen by Ortho.  Symptoms are: unchanged.  Triage Disposition: See PCP When Office is Open (Within 3 Days)  Patient/caregiver understands and will follow disposition?: Yes Pt called to have MRI order sent to [facility]. MRI was previously ordered by Dr. Jadine (ortho) but was sent somewhere out of network.   Copied from CRM [id number]. Topic: Clinical - Red Word Triage >> Aug 02, 2024  9:21 AM Michele Ellison wrote: Red Word that prompted transfer to Nurse Triage: patient has a broken foot and is experiencing  a burning sensation, with pain. She is requesting a referral for a mri Reason for Disposition  [1] After 2 weeks AND [2] still painful or swollen  Answer Assessment - Initial Assessment Questions 1. MECHANISM: How did the injury happen? (e.g., twisting injury, direct blow)      Twisting injury, stepped into a hole and twisted  2. ONSET: When did the injury happen? (e.g., minutes or hours ago)      Happened on 9/21  3. LOCATION: Where is the injury located?      Right foot  4. APPEARANCE of INJURY: What does the injury look like?      Looks pale, no swelling  5. WEIGHT-BEARING: Can you put weight on that foot? Can you walk (four steps or more)?       Mild weight bearing now, using crutches  6. SIZE: For cuts, bruises, or swelling, ask: How large is it? (e.g., inches or centimeters;  entire joint)      No  7. PAIN: Is there pain? If Yes, ask: How bad is the pain? What does it keep you from doing? (Scale 0-10; or none, mild, moderate, severe)     8/10- has been taking Ibuprofen  8. TETANUS: For any breaks in the skin, ask: When was your last tetanus booster?     No  9. OTHER SYMPTOMS: Do you have any  other symptoms?      NO  10. PREGNANCY: Is there any chance you are pregnant? When was your last menstrual period?       LMP- 19 days ago  Protocols used: Foot Injury-A-AH

## 2024-08-10 ENCOUNTER — Encounter: Payer: Self-pay | Admitting: Medical-Surgical

## 2024-08-10 ENCOUNTER — Ambulatory Visit: Admitting: Medical-Surgical

## 2024-08-10 VITALS — BP 122/72 | HR 69 | Resp 20 | Ht 60.0 in | Wt 116.0 lb

## 2024-08-10 DIAGNOSIS — S92014A Nondisplaced fracture of body of right calcaneus, initial encounter for closed fracture: Secondary | ICD-10-CM | POA: Diagnosis not present

## 2024-08-10 NOTE — Addendum Note (Signed)
 Addended byBETHA WILLO MINI on: 08/10/2024 12:20 PM   Modules accepted: Orders

## 2024-08-10 NOTE — Progress Notes (Addendum)
 Contacted [facility] to inquire about any hold-ups to scheduling the procedure.  Spoke with Geni who was unable to find an order from Dr. Orinda office.  In order to facilitate sooner imaging, placing order for MR right ankle/hindfoot without contrast.  Medical screening examination/treatment was performed by qualified clinical staff member and as supervising provider I was immediately available for consultation/collaboration. I have reviewed documentation and agree with assessment and plan.  Zada FREDRIK Palin, DNP, APRN, FNP-BC [facility]

## 2024-08-10 NOTE — Progress Notes (Signed)
 /  Established Patient Office Visit  Subjective   Patient ID: Michele Ellison, female    DOB: 1995/10/30  Age: 28 y.o. MRN: [id number]  Chief Complaint  Patient presents with   Foot Injury    Right foot    HPI 28 year old female presents for follow up on right foot fracture. Patient was seen by ortho on 07/09/24. MRI was ordered following x-ray. Patient states that the original order for the Mri was placed for [facility] which was out of network with her insurance. Patient states that she reached out to her orthopedists to change the referral to [facility]. Patient reached out to [facility] on 10/24 and 10/27 to schedule an appointment, but has not heard back. Patient would like to possibly repeat the x-ray instead proceeding with the MRI at this time. Patient states that she needs to return to work as soon as possible. Current pain level is 0/10 and at its worst 10/10 with increased activity. She is currently taking tramadol  about once a week for pain. Patient returned to work on 10/29, but did not go well due to the amount of standing and walking. Patient works at [employer].   Review of Systems  Constitutional: Negative.   HENT: Negative.    Eyes: Negative.   Respiratory: Negative.    Cardiovascular: Negative.   Gastrointestinal: Negative.   Genitourinary: Negative.   Musculoskeletal:        Right foot pain and right calcaneous fracture  Skin: Negative.   Neurological: Negative.   Endo/Heme/Allergies: Negative.   Psychiatric/Behavioral: Negative.        Objective:     BP 122/72 (BP Location: Right Arm, Cuff Size: Normal)   Pulse 69   Resp 20   Ht 5' (1.524 m)   Wt 52.6 kg   SpO2 99%   BMI 22.65 kg/m    Physical Exam Vitals and nursing note reviewed.  Constitutional:      General: She is not in acute distress.    Appearance: Normal appearance. She is normal weight.  Cardiovascular:     Rate and Rhythm: Normal rate and regular rhythm.      Pulses: Normal pulses.     Heart sounds: Normal heart sounds.  Pulmonary:     Effort: Pulmonary effort is normal.     Breath sounds: Normal breath sounds.  Musculoskeletal:        General: Swelling, tenderness and signs of injury present.     Comments: Ecchymosis  Neurological:     General: No focal deficit present.     Mental Status: She is alert and oriented to person, place, and time.  Psychiatric:        Mood and Affect: Mood normal.        Behavior: Behavior normal.        Thought Content: Thought content normal.        Judgment: Judgment normal.      No results found for any visits on 08/10/24.   The ASCVD Risk score (Arnett DK, et al., 2019) failed to calculate for the following reasons:   The 2019 ASCVD risk score is only valid for ages 59 to 77    Assessment & Plan:   1. Closed nondisplaced fracture of body of right calcaneus, initial encounter (Primary) -Proceed with MRI -Pending appt with [facility]  -Work note provided for patient to remain out of work pending further evaluation and plan for treatment   Return if symptoms worsen or fail  to improve.    Derrek JINNY Freund, NP Student

## 2024-08-19 ENCOUNTER — Ambulatory Visit
Admission: RE | Admit: 2024-08-19 | Discharge: 2024-08-19 | Disposition: A | Discharge status: Home / Self Care | Source: Ambulatory Visit | Attending: Medical-Surgical | Admitting: Medical-Surgical

## 2024-08-19 DIAGNOSIS — S92014A Nondisplaced fracture of body of right calcaneus, initial encounter for closed fracture: Secondary | ICD-10-CM

## 2024-08-21 ENCOUNTER — Ambulatory Visit: Payer: Self-pay | Admitting: Medical-Surgical

## 2024-08-21 DIAGNOSIS — S92014A Nondisplaced fracture of body of right calcaneus, initial encounter for closed fracture: Secondary | ICD-10-CM

## 2024-08-29 ENCOUNTER — Ambulatory Visit (INDEPENDENT_AMBULATORY_CARE_PROVIDER_SITE_OTHER): Admitting: Physician Assistant

## 2024-08-29 ENCOUNTER — Encounter: Payer: Self-pay | Admitting: Physician Assistant

## 2024-08-29 ENCOUNTER — Other Ambulatory Visit: Payer: Self-pay

## 2024-08-29 DIAGNOSIS — S92001A Unspecified fracture of right calcaneus, initial encounter for closed fracture: Secondary | ICD-10-CM

## 2024-08-29 DIAGNOSIS — M79671 Pain in right foot: Secondary | ICD-10-CM | POA: Diagnosis not present

## 2024-08-29 NOTE — Progress Notes (Signed)
 Office Visit Note   Patient: Michele Ellison           Date of Birth: Sep 26, 1996           MRN: [id number] Visit Date: 08/29/2024              Requested by: Willo Mini, NP [address] [place],  [place] [address] PCP: Willo Mini, NP  Chief Complaint  Patient presents with   Right Heel - Pain      HPI: 28 y/o female who sustained a foot injury.  She was out for a run and  stepped in a hole and twisted her foot and fell. She was seen at an urgent care and has been NWB in a cam boot for 2 months.  She was seen by [facility] and an MRI was ordered.  The MRI noted Redemonstrated nondisplaced fracture of the anterior calcaneal process with associated mild-to-moderate marrow edema.  Last week she accidentally put weight on her foot in the cam boot and felt/heard a pop in the achilles tendon area.    She has a history of right club foot deformity that was treated as a child.  She states her right foot and ankle have decreased motion secondary to the club foot.    Assessment & Plan: Visit Diagnoses:  1. Pain in right foot   2. Closed nondisplaced fracture of right calcaneus, unspecified portion of calcaneus, initial encounter     Plan: She will start weight bearing as tolerates in the cam boot.  She has a silicone heel cup to elevate her heel reducing stress on the achilles and for cushioning of the calcaneous.  She has been NWB for 8 weeks now and the x ray shows no evidence of calcaneal malalignment.  Elevation for swelling.  Remove the boot a few times a day to perform active motion of the ankle.    Follow-Up Instructions: Return in about 3 weeks (around 09/19/2024).   Ortho Exam  Patient is alert, oriented, no adenopathy, well-dressed, normal affect, normal respiratory effort. Tenderness to palpation at the achilles tendon without palpable tendon defect.  Neutral dorsiflexion passively.   Tenderness to palpation over the lateral calcaneous.  Good skin lines  without edema.  No cellulitis.  Palpable DP pulse.  No pain to palpation over the medial/lateral malleoli.      Imaging: Right foot x ray  Arthritic changes to the subtalar joint with subchondral cystic change Mild-to-moderate degenerative changes of the calcaneocuboid joint.   MRI reviewed  IMPRESSION: 1. Redemonstrated nondisplaced fracture of the anterior calcaneal process with associated mild-to-moderate marrow edema. 2. Mild-to-moderate degenerative changes of the posterior subtalar joint with subchondral edema and cystic change of the talar and calcaneal articular surfaces. 3. Chronic morphological deformity of the talus with mild degenerative changes of the tibiotalar joint. 4. Mild-to-moderate degenerative changes at the calcaneocuboid joint. 5. Mild degenerative changes of the talonavicular joint. 6. Diminutive appearance of the medial ankle tendons, most predominantly involving the posterior tibial tendon extending to the level of the insertion. No significant tenosynovitis. These findings may be secondary to sequelae of prior injury or congenital variation. 7. Attenuated appearance of the spring ligament, may reflect sequelae of prior trauma or congenital variation. 8. Loss of fat signal within the sinus tarsi, may reflect fibrosis.  Labs: Lab Results  Component Value Date   HGBA1C 5.1 06/29/2022   ESRSEDRATE 6 07/27/2022   LABURIC 3.3 07/27/2022     No results found for:  ALBUMIN, PREALBUMIN, CBC  No results found for: MG No results found for: VD25OH  No results found for: PREALBUMIN    Latest Ref Rng & Units 07/27/2022   12:00 AM 06/29/2022   12:00 AM 11/06/2020   10:33 AM  CBC EXTENDED  WBC 3.8 - 10.8 Thousand/uL 4.2  5.3  3.6   RBC 3.80 - 5.10 Million/uL 4.05  4.30  4.12   Hemoglobin 11.7 - 15.5 g/dL 87.7  87.2  87.8   HCT 35.0 - 45.0 % 36.9  39.5  37.0   Platelets 140 - 400 Thousand/uL 164  158  164   NEUT# 1,500 - 7,800 cells/uL 2,491   3,270  2,110   Lymph# 850 - 3,900 cells/uL 1,218  1,526  1,080      There is no height or weight on file to calculate BMI.  Orders:  Orders Placed This Encounter  Procedures   XR Foot Complete Right   No orders of the defined types were placed in this encounter.    Procedures: No procedures performed  Clinical Data: No additional findings.  ROS:  All other systems negative, except as noted in the HPI. Review of Systems  Objective: Vital Signs: There were no vitals taken for this visit.  Specialty Comments:  No specialty comments available.  PMFS History: Patient Active Problem List   Diagnosis Date Noted   Polyarthralgia 07/27/2022   Radiculitis of left cervical region 06/11/2021   Right hand pain 10/30/2019   Intractable migraine with aura without status migrainosus 09/30/2017   Right ankle pain 05/27/2015   Past Medical History:  Diagnosis Date   Anxiety    Arrhythmogenic right ventricular cardiomyopathy (HCC)    Arthritis    Atrial fibrillation (HCC)    GERD (gastroesophageal reflux disease)    Hypertension    Migraine    Sprain of ulnar collateral ligament of metacarpophalangeal (MCP) joint of right thumb 07/03/2019    Family History  Problem Relation Age of Onset   Hypertension Father    Diabetes Paternal Grandmother    Diabetes Paternal Grandfather    Hypertension Mother     Past Surgical History:  Procedure Laterality Date   CLUB FOOT RELEASE     WISDOM TOOTH EXTRACTION     WISDOM TOOTH EXTRACTION     Social History   Occupational History   Not on file  Tobacco Use   Smoking status: Never   Smokeless tobacco: Never  Vaping Use   Vaping status: Never Used  Substance and Sexual Activity   Alcohol use: Yes    Comment: Rarely   Drug use: Never   Sexual activity: Yes    Partners: Female

## 2024-09-19 ENCOUNTER — Encounter: Payer: Self-pay | Admitting: Physician Assistant

## 2024-09-19 ENCOUNTER — Ambulatory Visit (INDEPENDENT_AMBULATORY_CARE_PROVIDER_SITE_OTHER): Admitting: Physician Assistant

## 2024-09-19 DIAGNOSIS — S92001A Unspecified fracture of right calcaneus, initial encounter for closed fracture: Secondary | ICD-10-CM | POA: Diagnosis not present

## 2024-09-19 NOTE — Addendum Note (Signed)
 Addended byBETHA DUWAINE RIGGS on: 09/19/2024 12:41 PM   Modules accepted: Orders

## 2024-09-19 NOTE — Progress Notes (Signed)
 Office Visit Note   Patient: Michele Ellison           Date of Birth: 1996/08/01           MRN: [id number] Visit Date: 09/19/2024              Requested by: Willo Mini, NP [address] [place],  [place] [address] PCP: Willo Mini, NP  Chief Complaint  Patient presents with   Right Foot - Follow-up    Heel pain       HPI: 28 y/o female who sustained a foot injury.  She was out for a run and  stepped in a hole and twisted her foot and fell. She was seen at an urgent care and has been NWB in a cam boot for 2 months.  She was seen by Orth[facility] and an MRI was ordered.  The MRI noted Redemonstrated nondisplaced fracture of the anterior calcaneal process with associated mild-to-moderate marrow edema.  Last week she accidentally put weight on her foot in the cam boot and felt/heard a pop in the achilles tendon area.               She has a history of right club foot deformity that was treated as a child.  She states her right foot and ankle have decreased motion secondary to the club foot.   Assessment & Plan: Visit Diagnoses:  1. Closed nondisplaced fracture of right calcaneus, unspecified portion of calcaneus, initial encounter     Plan: WBAT in Cam boot.  ASO brace given to wear for support to wean out of cam boot.  She was referred to PT for strengthening and ROM with achilles stretching.  She will follow up in 3-4 weeks after she has worked with PT.  We will plan to take an x ray WB on her next visit.  Elevation PRN for welling.    Follow-Up Instructions: Return if symptoms worsen or fail to improve she will call after PT is completed.   Ortho Exam  Patient is alert, oriented, no adenopathy, well-dressed, normal affect, normal respiratory effort. Slight tenderness to palpation over the lateral calcaneous.  No edema or cellulitis in the left foot.  Palpable DP pulse.  Achilles to neutral and plantar flexion 10 degrees with stiffness.  Very minimal inversion and  eversion.      Imaging: No results found. No images are attached to the encounter.  Labs: Lab Results  Component Value Date   HGBA1C 5.1 06/29/2022   ESRSEDRATE 6 07/27/2022   LABURIC 3.3 07/27/2022     No results found for: ALBUMIN, PREALBUMIN, CBC  No results found for: MG No results found for: VD25OH  No results found for: PREALBUMIN    Latest Ref Rng & Units 07/27/2022   12:00 AM 06/29/2022   12:00 AM 11/06/2020   10:33 AM  CBC EXTENDED  WBC 3.8 - 10.8 Thousand/uL 4.2  5.3  3.6   RBC 3.80 - 5.10 Million/uL 4.05  4.30  4.12   Hemoglobin 11.7 - 15.5 g/dL 87.7  87.2  87.8   HCT 35.0 - 45.0 % 36.9  39.5  37.0   Platelets 140 - 400 Thousand/uL 164  158  164   NEUT# 1,500 - 7,800 cells/uL 2,491  3,270  2,110   Lymph# 850 - 3,900 cells/uL 1,218  1,526  1,080      There is no height or weight on file to calculate BMI.  Orders:  No orders  of the defined types were placed in this encounter.  No orders of the defined types were placed in this encounter.    Procedures: No procedures performed  Clinical Data: No additional findings.  ROS:  All other systems negative, except as noted in the HPI. Review of Systems  Objective: Vital Signs: There were no vitals taken for this visit.  Specialty Comments:  No specialty comments available.  PMFS History: Patient Active Problem List   Diagnosis Date Noted   Polyarthralgia 07/27/2022   Radiculitis of left cervical region 06/11/2021   Right hand pain 10/30/2019   Intractable migraine with aura without status migrainosus 09/30/2017   Right ankle pain 05/27/2015   Past Medical History:  Diagnosis Date   Anxiety    Arrhythmogenic right ventricular cardiomyopathy (HCC)    Arthritis    Atrial fibrillation (HCC)    GERD (gastroesophageal reflux disease)    Hypertension    Migraine    Sprain of ulnar collateral ligament of metacarpophalangeal (MCP) joint of right thumb 07/03/2019    Family History   Problem Relation Age of Onset   Hypertension Father    Diabetes Paternal Grandmother    Diabetes Paternal Grandfather    Hypertension Mother     Past Surgical History:  Procedure Laterality Date   CLUB FOOT RELEASE     WISDOM TOOTH EXTRACTION     WISDOM TOOTH EXTRACTION     Social History   Occupational History   Not on file  Tobacco Use   Smoking status: Never   Smokeless tobacco: Never  Vaping Use   Vaping status: Never Used  Substance and Sexual Activity   Alcohol use: Yes    Comment: Rarely   Drug use: Never   Sexual activity: Yes    Partners: Female

## 2024-09-26 NOTE — Therapy (Incomplete)
 OUTPATIENT PHYSICAL THERAPY LOWER EXTREMITY EVALUATION   Patient Name: Michele Ellison MRN: [id number] DOB:[birth year], 28 y.o., female Today's Date: 09/26/2024  END OF SESSION:   Past Medical History:  Diagnosis Date   Anxiety    Arrhythmogenic right ventricular cardiomyopathy (HCC)    Arthritis    Atrial fibrillation (HCC)    GERD (gastroesophageal reflux disease)    Hypertension    Migraine    Sprain of ulnar collateral ligament of metacarpophalangeal (MCP) joint of right thumb 07/03/2019   Past Surgical History:  Procedure Laterality Date   CLUB FOOT RELEASE     WISDOM TOOTH EXTRACTION     WISDOM TOOTH EXTRACTION     Patient Active Problem List   Diagnosis Date Noted   Polyarthralgia 07/27/2022   Radiculitis of left cervical region 06/11/2021   Right hand pain 10/30/2019   Intractable migraine with aura without status migrainosus 09/30/2017   Right ankle pain 05/27/2015    PCP: Willo Mini, NP  REFERRING PROVIDER: Willo Mini, NP  REFERRING DIAG: [phone] (ICD-10-CM) - Closed nondisplaced fracture of body of right calcaneus, initial encounter   THERAPY DIAG:  No diagnosis found.  Rationale for Evaluation and Treatment: Rehabilitation  ONSET DATE: 07/01/24  SUBJECTIVE:   SUBJECTIVE STATEMENT: Patient sustained a nondisplaced fracture of the Rt anterior calcaneal process in September 2025 from running stepping in a hole. She has been NWB in CAM boot for 2 months. She was instructed to begin WBAT in CAM boot.   PERTINENT HISTORY: Rt club foot deformity  PAIN:  Are you having pain? {OPRCPAIN:27236}  PRECAUTIONS: {Therapy precautions:24002}  RED FLAGS: {PT Red Flags:29287}   WEIGHT BEARING RESTRICTIONS: Yes WBAT in Cam boot RLE   FALLS:  Has patient fallen in last 6 months? {fallsyesno:27318}  LIVING ENVIRONMENT: Lives with: {OPRC lives with:25569::lives with their family} Lives in: {Lives in:25570} Stairs: {opstairs:27293} Has following  equipment at home: {Assistive devices:23999}  OCCUPATION: ***  PLOF: {PLOF:24004}  PATIENT GOALS: ***  NEXT MD VISIT: ***  OBJECTIVE:  Note: Objective measures were completed at Evaluation unless otherwise noted.  DIAGNOSTIC FINDINGS: ***  PATIENT SURVEYS:  LEFS: ***  COGNITION: Overall cognitive status: Within functional limits for tasks assessed     SENSATION: {sensation:27233}  EDEMA:  {edema:24020}  MUSCLE LENGTH: Hamstrings: Right *** deg; Left *** deg Debby test: Right *** deg; Left *** deg  POSTURE: {posture:25561}  PALPATION: ***  LOWER EXTREMITY ROM:  {AROM/PROM:27142} ROM Right eval Left eval  Hip flexion    Hip extension    Hip abduction    Hip adduction    Hip internal rotation    Hip external rotation    Knee flexion    Knee extension    Ankle dorsiflexion    Ankle plantarflexion    Ankle inversion    Ankle eversion     (Blank rows = not tested)  LOWER EXTREMITY MMT:  MMT Right eval Left eval  Hip flexion    Hip extension    Hip abduction    Hip adduction    Hip internal rotation    Hip external rotation    Knee flexion    Knee extension    Ankle dorsiflexion    Ankle plantarflexion    Ankle inversion    Ankle eversion     (Blank rows = not tested)  LOWER EXTREMITY SPECIAL TESTS:  {LEspecialtests:26242}  FUNCTIONAL TESTS:  {Functional tests:24029}  GAIT: Distance walked: *** Assistive device utilized: {Assistive devices:23999} Level of assistance: {Levels of assistance:24026} Comments: ***  TREATMENT DATE: ***    PATIENT EDUCATION:  Education details: *** Person educated: {Person educated:25204} Education method: {Education Method:25205} Education comprehension: {Education Comprehension:25206}  HOME EXERCISE PROGRAM: ***  ASSESSMENT:  CLINICAL IMPRESSION: Patient is a *** y.o.  *** who was seen today for physical therapy evaluation and treatment for ***.   OBJECTIVE IMPAIRMENTS: {opptimpairments:25111}.   ACTIVITY LIMITATIONS: {activitylimitations:27494}  PARTICIPATION LIMITATIONS: {participationrestrictions:25113}  PERSONAL FACTORS: {Personal factors:25162} are also affecting patient's functional outcome.   REHAB POTENTIAL: {rehabpotential:25112}  CLINICAL DECISION MAKING: {clinical decision making:25114}  EVALUATION COMPLEXITY: {Evaluation complexity:25115}   GOALS: Goals reviewed with patient? {yes/no:20286}  SHORT TERM GOALS: Target date: *** *** Baseline: Goal status: INITIAL  2.  *** Baseline:  Goal status: INITIAL  3.  *** Baseline:  Goal status: INITIAL  4.  *** Baseline:  Goal status: INITIAL  5.  *** Baseline:  Goal status: INITIAL  6.  *** Baseline:  Goal status: INITIAL  LONG TERM GOALS: Target date: ***  *** Baseline:  Goal status: INITIAL  2.  *** Baseline:  Goal status: INITIAL  3.  *** Baseline:  Goal status: INITIAL  4.  *** Baseline:  Goal status: INITIAL  5.  *** Baseline:  Goal status: INITIAL  6.  *** Baseline:  Goal status: INITIAL   PLAN:  PT FREQUENCY: {rehab frequency:25116}  PT DURATION: {rehab duration:25117}  PLANNED INTERVENTIONS: {rehab planned interventions:25118::97110-Therapeutic exercises,97530- Therapeutic [phone]- Neuromuscular re-education,97535- Self Rjmz,02859- Manual therapy,Patient/Family education}  PLAN FOR NEXT SESSION: ***  Lucie Meeter, PT, DPT, ATC 09/26/2024 9:03 AM

## 2024-09-27 ENCOUNTER — Ambulatory Visit: Admitting: Rehabilitative and Restorative Service Providers"

## 2024-10-02 ENCOUNTER — Encounter: Payer: Self-pay | Admitting: Physical Therapy

## 2024-10-02 ENCOUNTER — Other Ambulatory Visit: Payer: Self-pay

## 2024-10-02 ENCOUNTER — Ambulatory Visit: Attending: Medical-Surgical | Admitting: Physical Therapy

## 2024-10-02 DIAGNOSIS — M6281 Muscle weakness (generalized): Secondary | ICD-10-CM | POA: Diagnosis present

## 2024-10-02 DIAGNOSIS — M25571 Pain in right ankle and joints of right foot: Secondary | ICD-10-CM | POA: Diagnosis present

## 2024-10-02 DIAGNOSIS — S92014A Nondisplaced fracture of body of right calcaneus, initial encounter for closed fracture: Secondary | ICD-10-CM | POA: Insufficient documentation

## 2024-10-02 NOTE — Therapy (Signed)
 " OUTPATIENT PHYSICAL THERAPY LOWER EXTREMITY EVALUATION   Patient Name: Michele Ellison MRN: [id number] DOB:[birth year], 28 y.o., female Today's Date: 10/02/2024  END OF SESSION:  PT End of Session - 10/02/24 1506     Visit Number 1    Number of Visits 16    Date for Recertification  11/27/24    Authorization Type U[employer]    PT Start Time 1400    PT Stop Time 1440    PT Time Calculation (min) 40 min    Activity Tolerance Patient tolerated treatment well    Behavior During Therapy WFL for tasks assessed/performed          Past Medical History:  Diagnosis Date   Anxiety    Arrhythmogenic right ventricular cardiomyopathy (HCC)    Arthritis    Atrial fibrillation (HCC)    GERD (gastroesophageal reflux disease)    Hypertension    Migraine    Sprain of ulnar collateral ligament of metacarpophalangeal (MCP) joint of right thumb 07/03/2019   Past Surgical History:  Procedure Laterality Date   CLUB FOOT RELEASE     WISDOM TOOTH EXTRACTION     WISDOM TOOTH EXTRACTION     Patient Active Problem List   Diagnosis Date Noted   Polyarthralgia 07/27/2022   Radiculitis of left cervical region 06/11/2021   Right hand pain 10/30/2019   Intractable migraine with aura without status migrainosus 09/30/2017   Right ankle pain 05/27/2015    PCP: Willo Mini  REFERRING PROVIDER: Willo Mini  REFERRING DIAG: closed nondisplaced fracture of body of Rt calcaneous  THERAPY DIAG:  Muscle weakness (generalized)  Pain in right ankle and joints of right foot  Rationale for Evaluation and Treatment: Rehabilitation  ONSET DATE: 06/2024  SUBJECTIVE:   SUBJECTIVE STATEMENT: Pt states she was running with her son and she stepped in a hole, fracturing her Rt calcaneous. She was wearing a cam boot for ambulation until 1 week ago. She is wearing an soft ankle brace and a heel cushion in normal shoes. She states she must have the heel cushion due to increased pain walking without it. Pt is  currently out of work and was advised by MD to complete PT before returning to work. She works at the [employer] 3-7 hour shifts. She states her foot has not been too painful with ADLs and IADLs and she is no longer taking pain medication. She hopes to be able to return to full work duties soon.  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 Yes: NPRS scale: 0/10 currently, 5/10 at worst Pain location: Rt heel Pain description: sore Aggravating factors: prolonged standing or walking Relieving factors: rest  PRECAUTIONS: None  RED FLAGS: None   WEIGHT BEARING RESTRICTIONS: WBAT  FALLS:  Has patient fallen in last 6 months? 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   PATIENT SURVEYS:  PSFS: THE PATIENT SPECIFIC FUNCTIONAL SCALE  Place score of 0-10 (0 = unable to perform activity and 10 = able to perform activity at the same level as before injury or problem)  Activity Date: 10/02/24    Walk barefoot 1    2.full work duties 0    3.     4.  Average Score 0.5      Total Score = Sum of activity scores/number of activities  Minimally Detectable Change: 3 points (for single activity); 2 points (for average score)  [facility] (nd). The Patient Specific Functional Scale . Retrieved from [email or web address]     EDEMA:   None noted on eval  PALPATION: No TTP Hypomobile TC mobs Metatarsal and tarsal mobility WFL  LOWER EXTREMITY ROM:  Active / Passive ROM Right eval Left eval  Hip flexion    Hip extension    Hip abduction    Hip adduction    Hip internal rotation    Hip external rotation    Knee flexion    Knee extension    Ankle  dorsiflexion -4/0 [facility]  Ankle plantarflexion 20/25 [facility]  Ankle inversion 1/11 [facility]  Ankle eversion 7/11 WFL   (Blank rows = not tested)  LOWER EXTREMITY MMT:  MMT Right eval Left eval  Hip flexion    Hip extension    Hip abduction    Hip adduction    Hip internal rotation    Hip external rotation    Knee flexion    Knee extension    Ankle dorsiflexion 3 4  Ankle plantarflexion 3- 3+  Ankle inversion 3- 4  Ankle eversion 3- 4   (Blank rows = not tested)    FUNCTIONAL TESTS:   SLS Lt = 20 seconds, Rt = 4 seconds  GAIT: Distance walked: 100' Assistive device utilized: None Level of assistance: Complete Independence Comments: decreased DF Rt ankle, mild antalgic gait                                                                                                                                TREATMENT DATE: 10/02/24 See HEP Pt educated on PT POC and goals, HEP, rationale for treatment    PATIENT EDUCATION:  Education details: PT POC and goals, HEP Person educated: Patient Education method: Explanation, Demonstration, and Handouts Education comprehension: verbalized understanding and returned demonstration  HOME EXERCISE PROGRAM: Access Code: [email or web address] URL: [email or web address] Date: 10/02/2024 Prepared by: Darice Conine  Exercises - Gastroc Stretch on Wall  - 1 x daily - 7 x weekly - 1 sets - 3 reps - 20-30 seconds hold - Soleus Stretch on Wall  - 1 x daily - 7 x weekly - 1 sets - 3 reps - 20-30 seconds hold - Seated Ankle Inversion Eversion PROM  - 1 x daily - 7 x weekly - 3 sets - 10 reps - Ankle Dorsiflexion with Resistance  - 1 x daily - 7 x weekly - 2 sets - 10 reps - Long Sitting Ankle Plantar Flexion with Resistance  - 1 x daily - 7 x weekly - 2 sets - 10 reps - Ankle Inversion Eversion Towel Slide  - 1 x daily - 7 x weekly - 3 sets - 10 reps - Towel Scrunches  - 1 x daily -  7 x weekly - 1 sets - 1 reps - 1 minute  hold  ASSESSMENT:  CLINICAL IMPRESSION: Patient is a 28 y.o. female who was seen today for physical therapy evaluation and treatment for closed nondisplaced fracture of body of Rt calcaneous. She presents with decreased Rt ankle strength and mobility, joint hypomobility, decreased balance and decreased functional activity tolerance. Pt will benefit from skilled PT to address deficits and progress towards PLOF.   OBJECTIVE IMPAIRMENTS: decreased activity tolerance, decreased balance, decreased mobility, decreased ROM, decreased strength, and pain.   ACTIVITY LIMITATIONS: standing and locomotion level  PARTICIPATION LIMITATIONS: community activity and occupation  PERSONAL FACTORS: Time since onset of injury/illness/exacerbation and 1 comorbidity: Rt clubbed foot are also affecting patient's functional outcome.   REHAB POTENTIAL: Good  CLINICAL DECISION MAKING: Evolving/moderate complexity  EVALUATION COMPLEXITY: Moderate   GOALS: Goals reviewed with patient? Yes  SHORT TERM GOALS: Target date: 10/30/2024   Pt will be independent with initial HEP Baseline: Goal status: INITIAL  2.  Pt will demo SLS on Rt >= 20 seconds to demo improved balance Baseline:  Goal status: INITIAL    LONG TERM GOALS: Target date: 11/27/2024    Pt will be independent with advanced HEP Baseline:  Goal status: INITIAL  2.  Pt will improve Rt ankle strength to 4/5 to return to full work activities  Baseline: see above Goal status: INITIAL  3.  Pt will improve Rt ankle AROM by 10 degrees in all directions to be able to walk barefoot with decreased pain Baseline:  Goal status: INITIAL  4.  Pt will improve PSFS to >= 3.5 to demo improved functional mobility Baseline:  Goal status: INITIAL    PLAN:  PT FREQUENCY: 2x/week  PT DURATION: 8 weeks  PLANNED INTERVENTIONS: 97164- PT Re-evaluation, 97110-Therapeutic exercises, 97530- Therapeutic activity, 97112- Neuromuscular re-education,  97535- Self Care, 02859- Manual therapy, [phone]- Gait training, [phone]- Aquatic Therapy, [phone]- Electrical stimulation (unattended), [phone]- Ionotophoresis 4mg /ml Dexamethasone, 79439 (1-2 muscles), 20561 (3+ muscles)- Dry Needling, Patient/Family education, Balance training, Stair training, Taping, Cryotherapy, and Moist heat  PLAN FOR NEXT SESSION: assess response to HEP, Rt ankle strength and mobility   Ediberto Sens, PT 10/02/2024, 3:07 PM  "

## 2024-10-10 ENCOUNTER — Ambulatory Visit

## 2024-10-10 DIAGNOSIS — M6281 Muscle weakness (generalized): Secondary | ICD-10-CM

## 2024-10-10 DIAGNOSIS — M25571 Pain in right ankle and joints of right foot: Secondary | ICD-10-CM

## 2024-10-10 NOTE — Therapy (Signed)
 " OUTPATIENT PHYSICAL THERAPY LOWER EXTREMITY TREATMENT   Patient Name: Michele Ellison MRN: [id number] DOB:1996-09-13, 28 y.o., female Today's Date: 10/10/2024  END OF SESSION:  PT End of Session - 10/10/24 1405     Visit Number 2    Number of Visits 16    Date for Recertification  11/27/24    Authorization Type UHC    PT Start Time 1405    PT Stop Time 1444    PT Time Calculation (min) 39 min    Activity Tolerance Patient tolerated treatment well    Behavior During Therapy WFL for tasks assessed/performed           Past Medical History:  Diagnosis Date   Anxiety    Arrhythmogenic right ventricular cardiomyopathy (HCC)    Arthritis    Atrial fibrillation (HCC)    GERD (gastroesophageal reflux disease)    Hypertension    Migraine    Sprain of ulnar collateral ligament of metacarpophalangeal (MCP) joint of right thumb 07/03/2019   Past Surgical History:  Procedure Laterality Date   CLUB FOOT RELEASE     WISDOM TOOTH EXTRACTION     WISDOM TOOTH EXTRACTION     Patient Active Problem List   Diagnosis Date Noted   Polyarthralgia 07/27/2022   Radiculitis of left cervical region 06/11/2021   Right hand pain 10/30/2019   Intractable migraine with aura without status migrainosus 09/30/2017   Right ankle pain 05/27/2015    PCP: Willo Mini  REFERRING PROVIDER: Willo Mini  REFERRING DIAG: closed nondisplaced fracture of body of Rt calcaneous  THERAPY DIAG:  Muscle weakness (generalized)  Pain in right ankle and joints of right foot  Rationale for Evaluation and Treatment: Rehabilitation  ONSET DATE: 06/2024  SUBJECTIVE:   SUBJECTIVE STATEMENT: Patient reports she is feeling pretty good. Took her brace off and just has a heel cup now. Has not had any issues walking with the heel cup.   EVAL: Pt states she was running with her son and she stepped in a hole, fracturing her Rt calcaneous. She was wearing a cam boot for ambulation until 1 week ago. She is wearing  an soft ankle brace and a heel cushion in normal shoes. She states she must have the heel cushion due to increased pain walking without it. Pt is currently out of work and was advised by MD to complete PT before returning to work. She works at the [employer] 3-7 hour shifts. She states her foot has not been too painful with ADLs and IADLs and she is no longer taking pain medication. She hopes to be able to return to full work duties soon.  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 Yes: NPRS scale: 0/10 currently, 5/10 at worst Pain location: Rt heel Pain description: sore Aggravating factors: prolonged standing or walking Relieving factors: rest  PRECAUTIONS: None  RED FLAGS: None   WEIGHT BEARING RESTRICTIONS: WBAT  FALLS:  Has patient fallen in last 6 months? 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   PATIENT SURVEYS:  PSFS: THE PATIENT SPECIFIC FUNCTIONAL SCALE  Place score of 0-10 (0 = unable to perform activity and 10 = able to perform activity at the same level as before  injury or problem)  Activity Date: 10/02/24    Walk barefoot 1    2.full work duties 0    3.     4.      Average Score 0.5      Total Score = Sum of activity scores/number of activities  Minimally Detectable Change: 3 points (for single activity); 2 points (for average score)  [facility] (nd). The Patient Specific Functional Scale . Retrieved from [email or web address]     EDEMA:   None noted on eval  PALPATION: No TTP Hypomobile TC mobs Metatarsal and tarsal mobility WFL  LOWER EXTREMITY ROM:  Active / Passive ROM Right eval Left eval  Hip flexion     Hip extension    Hip abduction    Hip adduction    Hip internal rotation    Hip external rotation    Knee flexion    Knee extension    Ankle dorsiflexion -4/0 [facility]  Ankle plantarflexion 20/25 [facility]  Ankle inversion 1/11 [facility]  Ankle eversion 7/11 WFL   (Blank rows = not tested)  LOWER EXTREMITY MMT:  MMT Right eval Left eval  Hip flexion    Hip extension    Hip abduction    Hip adduction    Hip internal rotation    Hip external rotation    Knee flexion    Knee extension    Ankle dorsiflexion 3 4  Ankle plantarflexion 3- 3+  Ankle inversion 3- 4  Ankle eversion 3- 4   (Blank rows = not tested)    FUNCTIONAL TESTS:   SLS Lt = 20 seconds, Rt = 4 seconds  GAIT: Distance walked: 100' Assistive device utilized: None Level of assistance: Complete Independence Comments: decreased DF Rt ankle, mild antalgic gait    [facility] Adult PT Treatment:                                                DATE: 10/10/24 Therapeutic Exercise: Ankle circles 2 x 10; CW/CCW ABCs lower case x 1  Seated ankle rockerboard A/P 2 x 10 Calf stretch with strap 2 x 30 sec   Ankle inversion AROM 2 x 10 Ankle eversion AROM 2 x 10  Seated toe raises 2 x 10   Neuromuscular re-ed: Seated calf raise on 4 inch step 2 x 10  Ankle inversion isometric 2 x 10; 5 sec hold  Ankle eversion isometric 2 x 10; 5 sec hold  Great toe extension x 5                                                                                                                               TREATMENT DATE: 10/02/24 See HEP Pt educated on PT POC and goals, HEP, rationale for treatment    PATIENT EDUCATION:  Education details: HEP  review Person educated: Patient Education method: Explanation and Demonstration Education comprehension: verbalized understanding and returned demonstration  HOME EXERCISE PROGRAM: Access Code: [email or web address] URL: [email or web address] Date: 10/02/2024 Prepared by: Darice Conine  Exercises - Gastroc Stretch on Wall  - 1 x daily - 7 x weekly - 1 sets - 3 reps - 20-30 seconds hold - Soleus Stretch on Wall  - 1 x daily - 7 x weekly - 1 sets - 3 reps - 20-30 seconds hold - Seated Ankle Inversion Eversion PROM  - 1 x daily - 7 x weekly - 3 sets - 10 reps - Ankle Dorsiflexion with Resistance  - 1 x daily - 7 x weekly - 2 sets - 10 reps - Long Sitting Ankle Plantar Flexion with Resistance  - 1 x daily - 7 x weekly - 2 sets - 10 reps - Ankle Inversion Eversion Towel Slide  - 1 x daily - 7 x weekly - 3 sets - 10 reps - Towel Scrunches  - 1 x daily - 7 x weekly - 1 sets - 1 reps - 1 minute hold  ASSESSMENT:  CLINICAL IMPRESSION: Focused on progression of ankle mobility and strengthening with good tolerance. Patient able to complete small range of ankle inversion and eversion AROM without compensatory knee engagement.  Fatigues with majority of strengthening and AROM activity, but no onset of pain.   EVAL: Patient is a 28 y.o. female who was seen today for physical therapy evaluation and treatment for closed nondisplaced fracture of body of Rt calcaneous. She presents with decreased Rt ankle strength and mobility, joint hypomobility, decreased balance and decreased functional activity tolerance. Pt will benefit from skilled PT to address deficits and progress towards PLOF.   OBJECTIVE IMPAIRMENTS: decreased activity tolerance, decreased balance, decreased mobility, decreased ROM, decreased strength, and pain.   ACTIVITY LIMITATIONS: standing and locomotion level  PARTICIPATION LIMITATIONS: community activity and occupation  PERSONAL FACTORS: Time since onset of injury/illness/exacerbation and 1 comorbidity: Rt clubbed foot are also affecting patient's functional outcome.   REHAB POTENTIAL: Good  CLINICAL DECISION MAKING: Evolving/moderate complexity  EVALUATION COMPLEXITY: Moderate   GOALS: Goals reviewed with patient? Yes  SHORT TERM GOALS: Target date:  10/30/2024   Pt will be independent with initial HEP Baseline: Goal status: INITIAL  2.  Pt will demo SLS on Rt >= 20 seconds to demo improved balance Baseline:  Goal status: INITIAL    LONG TERM GOALS: Target date: 11/27/2024    Pt will be independent with advanced HEP Baseline:  Goal status: INITIAL  2.  Pt will improve Rt ankle strength to 4/5 to return to full work activities  Baseline: see above Goal status: INITIAL  3.  Pt will improve Rt ankle AROM by 10 degrees in all directions to be able to walk barefoot with decreased pain Baseline:  Goal status: INITIAL  4.  Pt will improve PSFS to >= 3.5 to demo improved functional mobility Baseline:  Goal status: INITIAL    PLAN:  PT FREQUENCY: 2x/week  PT DURATION: 8 weeks  PLANNED INTERVENTIONS: 97164- PT Re-evaluation, 97110-Therapeutic exercises, 97530- Therapeutic activity, 97112- Neuromuscular re-education, 97535- Self Care, 02859- Manual therapy, [phone]- Gait training, [phone]- Aquatic Therapy, [phone]- Electrical stimulation (unattended), [phone]- Ionotophoresis 4mg /ml Dexamethasone, 79439 (1-2 muscles), 20561 (3+ muscles)- Dry Needling, Patient/Family education, Balance training, Stair training, Taping, Cryotherapy, and Moist heat  PLAN FOR NEXT SESSION: assess response to HEP, Rt ankle strength and mobility  Lucie Meeter, PT, DPT, ATC 10/10/2024 2:44 PM  "

## 2024-10-16 ENCOUNTER — Ambulatory Visit: Attending: Medical-Surgical | Admitting: Physical Therapy

## 2024-10-16 ENCOUNTER — Encounter: Payer: Self-pay | Admitting: Physical Therapy

## 2024-10-16 DIAGNOSIS — M25571 Pain in right ankle and joints of right foot: Secondary | ICD-10-CM | POA: Diagnosis present

## 2024-10-16 DIAGNOSIS — M6281 Muscle weakness (generalized): Secondary | ICD-10-CM | POA: Diagnosis present

## 2024-10-16 NOTE — Therapy (Signed)
 " OUTPATIENT PHYSICAL THERAPY LOWER EXTREMITY TREATMENT   Patient Name: Michele Ellison MRN: [id number] DOB:03-26-1996, 29 y.o., female Today's Date: 10/16/2024  END OF SESSION:  PT End of Session - 10/16/24 1230     Visit Number 3    Number of Visits 16    Date for Recertification  11/27/24    Authorization Type UHC    PT Start Time 1152    PT Stop Time 1230    PT Time Calculation (min) 38 min    Activity Tolerance Patient tolerated treatment well    Behavior During Therapy WFL for tasks assessed/performed            Past Medical History:  Diagnosis Date   Anxiety    Arrhythmogenic right ventricular cardiomyopathy (HCC)    Arthritis    Atrial fibrillation (HCC)    GERD (gastroesophageal reflux disease)    Hypertension    Migraine    Sprain of ulnar collateral ligament of metacarpophalangeal (MCP) joint of right thumb 07/03/2019   Past Surgical History:  Procedure Laterality Date   CLUB FOOT RELEASE     WISDOM TOOTH EXTRACTION     WISDOM TOOTH EXTRACTION     Patient Active Problem List   Diagnosis Date Noted   Polyarthralgia 07/27/2022   Radiculitis of left cervical region 06/11/2021   Right hand pain 10/30/2019   Intractable migraine with aura without status migrainosus 09/30/2017   Right ankle pain 05/27/2015    PCP: Willo Mini  REFERRING PROVIDER: Willo Mini  REFERRING DIAG: closed nondisplaced fracture of body of Rt calcaneous  THERAPY DIAG:  Muscle weakness (generalized)  Pain in right ankle and joints of right foot  Rationale for Evaluation and Treatment: Rehabilitation  ONSET DATE: 06/2024  SUBJECTIVE:   SUBJECTIVE STATEMENT: Patient reports she is feeling pretty good. Continues to feel good walking with just heel cup  EVAL: Pt states she was running with her son and she stepped in a hole, fracturing her Rt calcaneous. She was wearing a cam boot for ambulation until 1 week ago. She is wearing an soft ankle brace and a heel cushion in normal  shoes. She states she must have the heel cushion due to increased pain walking without it. Pt is currently out of work and was advised by MD to complete PT before returning to work. She works at the [employer] 3-7 hour shifts. She states her foot has not been too painful with ADLs and IADLs and she is no longer taking pain medication. She hopes to be able to return to full work duties soon.  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 Yes: NPRS scale: 0/10 currently, 5/10 at worst Pain location: Rt heel Pain description: sore Aggravating factors: prolonged standing or walking Relieving factors: rest  PRECAUTIONS: None  RED FLAGS: None   WEIGHT BEARING RESTRICTIONS: WBAT  FALLS:  Has patient fallen in last 6 months? 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   PATIENT SURVEYS:  PSFS: THE PATIENT SPECIFIC FUNCTIONAL SCALE  Place score of 0-10 (0 = unable to perform activity and 10 = able to perform activity at the same level as before injury or problem)  Activity Date: 10/02/24    Walk barefoot  1    2.full work duties 0    3.     4.      Average Score 0.5      Total Score = Sum of activity scores/number of activities  Minimally Detectable Change: 3 points (for single activity); 2 points (for average score)  [facility] (nd). The Patient Specific Functional Scale . Retrieved from Skateoasis.com.pt     EDEMA:   None noted on eval  PALPATION: No TTP Hypomobile TC mobs Metatarsal and tarsal mobility WFL  LOWER EXTREMITY ROM:  Active / Passive ROM Right eval Left eval  Hip flexion    Hip extension    Hip abduction    Hip adduction     Hip internal rotation    Hip external rotation    Knee flexion    Knee extension    Ankle dorsiflexion -4/0 [facility]  Ankle plantarflexion 20/25 [facility]  Ankle inversion 1/11 [facility]  Ankle eversion 7/11 WFL   (Blank rows = not tested)  LOWER EXTREMITY MMT:  MMT Right eval Left eval  Hip flexion    Hip extension    Hip abduction    Hip adduction    Hip internal rotation    Hip external rotation    Knee flexion    Knee extension    Ankle dorsiflexion 3 4  Ankle plantarflexion 3- 3+  Ankle inversion 3- 4  Ankle eversion 3- 4   (Blank rows = not tested)    FUNCTIONAL TESTS:   SLS Lt = 20 seconds, Rt = 4 seconds  GAIT: Distance walked: 100' Assistive device utilized: None Level of assistance: Complete Independence Comments: decreased DF Rt ankle, mild antalgic gait   [facility] Adult PT Treatment:                                                DATE: 10/16/24 Therapeutic Exercise: Ankle circles 2 x 10; CW/CCW Seated ankle rockerboard A/P 2 x 10 Seated ankle inv/ev on towel 2 x 10 Seated toe raises 2 x 10  Toe flexion/extension Standing gastroc stretch Standing soleus stretch  Neuromuscular re-ed: Seated inv isometric x 5 - pt unable to complete more due to fatigue Tandem stance Rt foot in rear x 60 sec Tandem stance on foam Rt foot in rear 2 x 30 sec Seated ankle PF/DF and inv/ev on coregeous ball with manual assistance to improve ROM    [facility] Adult PT Treatment:                                                DATE: 10/10/24 Therapeutic Exercise: Ankle circles 2 x 10; CW/CCW ABCs lower case x 1  Seated ankle rockerboard A/P 2 x 10 Calf stretch with strap 2 x 30 sec   Ankle inversion AROM 2 x 10 Ankle eversion AROM 2 x 10  Seated toe raises 2 x 10   Neuromuscular re-ed: Seated calf raise on 4 inch step 2 x 10  Ankle inversion isometric 2 x 10; 5 sec hold  Ankle eversion isometric 2 x 10; 5 sec hold  Great toe extension x 5  TREATMENT DATE: 10/02/24 See HEP Pt educated on PT POC and goals, HEP, rationale for treatment    PATIENT EDUCATION:  Education details: HEP review Person educated: Patient Education method: Medical Illustrator Education comprehension: verbalized understanding and returned demonstration  HOME EXERCISE PROGRAM: Access Code: [email or web address] URL: [email or web address] Date: 10/02/2024 Prepared by: Darice Conine  Exercises - Gastroc Stretch on Wall  - 1 x daily - 7 x weekly - 1 sets - 3 reps - 20-30 seconds hold - Soleus Stretch on Wall  - 1 x daily - 7 x weekly - 1 sets - 3 reps - 20-30 seconds hold - Seated Ankle Inversion Eversion PROM  - 1 x daily - 7 x weekly - 3 sets - 10 reps - Ankle Dorsiflexion with Resistance  - 1 x daily - 7 x weekly - 2 sets - 10 reps - Long Sitting Ankle Plantar Flexion with Resistance  - 1 x daily - 7 x weekly - 2 sets - 10 reps - Ankle Inversion Eversion Towel Slide  - 1 x daily - 7 x weekly - 3 sets - 10 reps - Towel Scrunches  - 1 x daily - 7 x weekly - 1 sets - 1 reps - 1 minute hold  ASSESSMENT:  CLINICAL IMPRESSION: Pt with no pain with exercises but she does continue to fatigue quickly and require frequent rest breaks during exercise. Progressed to gentle overpressure with mobility on coregeous ball with pt reporting feeling good. Pt sees MD tomorrow to determine plan for return to work  EVAL: Patient is a 29 y.o. female who was seen today for physical therapy evaluation and treatment for closed nondisplaced fracture of body of Rt calcaneous. She presents with decreased Rt ankle strength and mobility, joint hypomobility, decreased balance and decreased functional activity tolerance. Pt will benefit from skilled PT to address deficits and progress towards PLOF.   OBJECTIVE IMPAIRMENTS: decreased activity tolerance, decreased balance, decreased  mobility, decreased ROM, decreased strength, and pain.      GOALS: Goals reviewed with patient? Yes  SHORT TERM GOALS: Target date: 10/30/2024   Pt will be independent with initial HEP Baseline: Goal status: INITIAL  2.  Pt will demo SLS on Rt >= 20 seconds to demo improved balance Baseline:  Goal status: INITIAL    LONG TERM GOALS: Target date: 11/27/2024    Pt will be independent with advanced HEP Baseline:  Goal status: INITIAL  2.  Pt will improve Rt ankle strength to 4/5 to return to full work activities  Baseline: see above Goal status: INITIAL  3.  Pt will improve Rt ankle AROM by 10 degrees in all directions to be able to walk barefoot with decreased pain Baseline:  Goal status: INITIAL  4.  Pt will improve PSFS to >= 3.5 to demo improved functional mobility Baseline:  Goal status: INITIAL    PLAN:  PT FREQUENCY: 2x/week  PT DURATION: 8 weeks  PLANNED INTERVENTIONS: 97164- PT Re-evaluation, 97110-Therapeutic exercises, 97530- Therapeutic activity, 97112- Neuromuscular re-education, 97535- Self Care, 02859- Manual therapy, [phone]- Gait training, [phone]- Aquatic Therapy, [phone]- Electrical stimulation (unattended), [phone]- Ionotophoresis 4mg /ml Dexamethasone, 79439 (1-2 muscles), [phone] (3+ muscles)- Dry Needling, Patient/Family education, Balance training, Stair training, Taping, Cryotherapy, and Moist heat  PLAN FOR NEXT SESSION: Rt ankle strength and mobility  Darice Conine, PT,DPT1/6/202612:31 PM   "

## 2024-10-17 ENCOUNTER — Encounter: Payer: Self-pay | Admitting: Physician Assistant

## 2024-10-17 ENCOUNTER — Ambulatory Visit: Admitting: Physician Assistant

## 2024-10-17 DIAGNOSIS — S92001A Unspecified fracture of right calcaneus, initial encounter for closed fracture: Secondary | ICD-10-CM | POA: Diagnosis not present

## 2024-10-17 NOTE — Progress Notes (Signed)
 "  Office Visit Note   Patient: Michele Ellison           Date of Birth: 1996-09-21           MRN: [id number] Visit Date: 10/17/2024              Requested by: Willo Mini, NP [address] [place],  [place] [address] PCP: Willo Mini, NP  Chief Complaint  Patient presents with   Right Foot - Pain      HPI: 29 y/o female who sustained a foot injury. She was out for a run and stepped in a hole and twisted her foot and fell.  She was diagnosed with a non displaced fracture of the anterior calcaneal process.  She was NWB in a cam boot for 2 months, followed by WBAT and now is in PT for strengthening and mobility.  She is WBAT with a heel cup.    She has a history of right club foot deformity that was treated as a child. She states her right foot and ankle have decreased motion secondary to the club foot.   Assessment & Plan: Visit Diagnoses:  1. Closed nondisplaced fracture of right calcaneus, unspecified portion of calcaneus, initial encounter     Plan: Healed non displaced calcaneal fracture. WBAT continue home exercise program. She will return to work without no restrictions on 10/22/24.    Follow-Up Instructions: Return if symptoms worsen or fail to improve.   Ortho Exam  Patient is alert, oriented, no adenopathy, well-dressed, normal affect, normal respiratory effort. Right ankle with non painful motion.  No edema or cellulitis.   Limited inversion back to baseline.  Non tenderness to palpation to the calcaneous.  Gait WNL back to her baseline.       Imaging: No results found. No images are attached to the encounter.  Labs: Lab Results  Component Value Date   HGBA1C 5.1 06/29/2022   ESRSEDRATE 6 07/27/2022   LABURIC 3.3 07/27/2022     No results found for: ALBUMIN, PREALBUMIN, CBC  No results found for: MG No results found for: VD25OH  No results found for: PREALBUMIN    Latest Ref Rng & Units 07/27/2022   12:00 AM 06/29/2022    12:00 AM 11/06/2020   10:33 AM  CBC EXTENDED  WBC 3.8 - 10.8 Thousand/uL 4.2  5.3  3.6   RBC 3.80 - 5.10 Million/uL 4.05  4.30  4.12   Hemoglobin 11.7 - 15.5 g/dL 87.7  87.2  87.8   HCT 35.0 - 45.0 % 36.9  39.5  37.0   Platelets 140 - 400 Thousand/uL 164  158  164   NEUT# 1,500 - 7,800 cells/uL 2,491  3,270  2,110   Lymph# 850 - 3,900 cells/uL 1,218  1,526  1,080      There is no height or weight on file to calculate BMI.  Orders:  No orders of the defined types were placed in this encounter.  No orders of the defined types were placed in this encounter.    Procedures: No procedures performed  Clinical Data: No additional findings.  ROS:  All other systems negative, except as noted in the HPI. Review of Systems  Objective: Vital Signs: There were no vitals taken for this visit.  Specialty Comments:  No specialty comments available.  PMFS History: Patient Active Problem List   Diagnosis Date Noted   Polyarthralgia 07/27/2022   Radiculitis of left cervical region 06/11/2021   Right hand  pain 10/30/2019   Intractable migraine with aura without status migrainosus 09/30/2017   Right ankle pain 05/27/2015   Past Medical History:  Diagnosis Date   Anxiety    Arrhythmogenic right ventricular cardiomyopathy (HCC)    Arthritis    Atrial fibrillation (HCC)    GERD (gastroesophageal reflux disease)    Hypertension    Migraine    Sprain of ulnar collateral ligament of metacarpophalangeal (MCP) joint of right thumb 07/03/2019    Family History  Problem Relation Age of Onset   Hypertension Father    Diabetes Paternal Grandmother    Diabetes Paternal Grandfather    Hypertension Mother     Past Surgical History:  Procedure Laterality Date   CLUB FOOT RELEASE     WISDOM TOOTH EXTRACTION     WISDOM TOOTH EXTRACTION     Social History   Occupational History   Not on file  Tobacco Use   Smoking status: Never   Smokeless tobacco: Never  Vaping Use   Vaping  status: Never Used  Substance and Sexual Activity   Alcohol use: Yes    Comment: Rarely   Drug use: Never   Sexual activity: Yes    Partners: Female       "

## 2024-10-18 ENCOUNTER — Ambulatory Visit (INDEPENDENT_AMBULATORY_CARE_PROVIDER_SITE_OTHER)

## 2024-10-18 DIAGNOSIS — M25571 Pain in right ankle and joints of right foot: Secondary | ICD-10-CM

## 2024-10-18 DIAGNOSIS — M6281 Muscle weakness (generalized): Secondary | ICD-10-CM | POA: Diagnosis not present

## 2024-10-18 NOTE — Therapy (Signed)
 " OUTPATIENT PHYSICAL THERAPY LOWER EXTREMITY TREATMENT   Patient Name: Michele Ellison MRN: [id number] DOB:Apr 12, 1996, 29 y.o., female Today's Date: 10/18/2024  END OF SESSION:  PT End of Session - 10/18/24 1404     Visit Number 4    Number of Visits 16    Date for Recertification  11/27/24    Authorization Type UHC    PT Start Time 1405    PT Stop Time 1445    PT Time Calculation (min) 40 min    Activity Tolerance Patient tolerated treatment well    Behavior During Therapy WFL for tasks assessed/performed         Past Medical History:  Diagnosis Date   Anxiety    Arrhythmogenic right ventricular cardiomyopathy (HCC)    Arthritis    Atrial fibrillation (HCC)    GERD (gastroesophageal reflux disease)    Hypertension    Migraine    Sprain of ulnar collateral ligament of metacarpophalangeal (MCP) joint of right thumb 07/03/2019   Past Surgical History:  Procedure Laterality Date   CLUB FOOT RELEASE     WISDOM TOOTH EXTRACTION     WISDOM TOOTH EXTRACTION     Patient Active Problem List   Diagnosis Date Noted   Polyarthralgia 07/27/2022   Radiculitis of left cervical region 06/11/2021   Right hand pain 10/30/2019   Intractable migraine with aura without status migrainosus 09/30/2017   Right ankle pain 05/27/2015    PCP: Willo Mini  REFERRING PROVIDER: Willo Mini  REFERRING DIAG: closed nondisplaced fracture of body of Rt calcaneous  THERAPY DIAG:  Muscle weakness (generalized)  Pain in right ankle and joints of right foot  Rationale for Evaluation and Treatment: Rehabilitation  ONSET DATE: 06/2024  SUBJECTIVE:   SUBJECTIVE STATEMENT: Patient reports she has had some tightness and big stretch on outside of Rt ankle; states sharp pain is brief. Patient states she returns to work on Monday with a 4-5 hour shift x 4-5 days.  EVAL: Pt states she was running with her son and she stepped in a hole, fracturing her Rt calcaneous. She was wearing a cam boot for  ambulation until 1 week ago. She is wearing an soft ankle brace and a heel cushion in normal shoes. She states she must have the heel cushion due to increased pain walking without it. Pt is currently out of work and was advised by MD to complete PT before returning to work. She works at the [employer] 3-7 hour shifts. She states her foot has not been too painful with ADLs and IADLs and she is no longer taking pain medication. She hopes to be able to return to full work duties soon.  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 Yes: NPRS scale: 0/10 currently, 5/10 at worst Pain location: Rt heel Pain description: sore Aggravating factors: prolonged standing or walking Relieving factors: rest  PRECAUTIONS: None  RED FLAGS: None   WEIGHT BEARING RESTRICTIONS: WBAT  FALLS:  Has patient fallen in last 6 months? 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   PATIENT SURVEYS:  PSFS: THE PATIENT SPECIFIC FUNCTIONAL SCALE  Place score of 0-10 (0 = unable to perform activity and 10 = able to perform activity at  the same level as before injury or problem)  Activity Date: 10/02/24    Walk barefoot 1    2.full work duties 0    3.     4.      Average Score 0.5      Total Score = Sum of activity scores/number of activities  Minimally Detectable Change: 3 points (for single activity); 2 points (for average score)  [facility] (nd). The Patient Specific Functional Scale . Retrieved from Skateoasis.com.pt     EDEMA:   None noted on eval  PALPATION: No TTP Hypomobile TC mobs Metatarsal and tarsal mobility WFL  LOWER EXTREMITY ROM:  Active / Passive ROM  Right eval Left eval  Hip flexion    Hip extension    Hip abduction    Hip adduction    Hip internal rotation    Hip external rotation    Knee flexion    Knee extension    Ankle dorsiflexion -4/0 [facility]  Ankle plantarflexion 20/25 [facility]  Ankle inversion 1/11 [facility]  Ankle eversion 7/11 WFL   (Blank rows = not tested)  LOWER EXTREMITY MMT:  MMT Right eval Left eval  Hip flexion    Hip extension    Hip abduction    Hip adduction    Hip internal rotation    Hip external rotation    Knee flexion    Knee extension    Ankle dorsiflexion 3 4  Ankle plantarflexion 3- 3+  Ankle inversion 3- 4  Ankle eversion 3- 4   (Blank rows = not tested)    FUNCTIONAL TESTS:   SLS Lt = 20 seconds, Rt = 4 seconds  GAIT: Distance walked: 100' Assistive device utilized: None Level of assistance: Complete Independence Comments: decreased DF Rt ankle, mild antalgic gait    [facility] Adult PT Treatment:                                                DATE: 10/18/2024 Manual Therapy: STM/IASTM gastroc & soleus Neuromuscular re-ed: Tandem balance  Seated eccentric (Rt) heel raise + 10#KB resting on knee x20 Therapeutic Activity: Seated: Toe yoga Ankle ever/inver on rocker board (assisted) Ankle PF/DF on rocker board Towel scrunch in & towel push out    [facility] Adult PT Treatment:                                                DATE: 10/16/24 Therapeutic Exercise: Ankle circles 2 x 10; CW/CCW Seated ankle rockerboard A/P 2 x 10 Seated ankle inv/ev on towel 2 x 10 Seated toe raises 2 x 10  Toe flexion/extension Standing gastroc stretch Standing soleus stretch  Neuromuscular re-ed: Seated inv isometric x 5 - pt unable to complete more due to fatigue Tandem stance Rt foot in rear x 60 sec Tandem stance on foam Rt foot in rear 2 x 30 sec Seated ankle PF/DF and inv/ev on coregeous ball with manual assistance to improve ROM    [facility] Adult PT Treatment:  DATE: 10/10/24 Therapeutic Exercise: Ankle circles 2 x 10; CW/CCW ABCs lower case x 1  Seated ankle rockerboard A/P 2 x 10 Calf stretch with strap 2 x 30 sec   Ankle inversion AROM 2 x 10 Ankle eversion AROM 2 x 10  Seated toe raises 2 x 10   Neuromuscular re-ed: Seated calf raise on 4 inch step 2 x 10  Ankle inversion isometric 2 x 10; 5 sec hold  Ankle eversion isometric 2 x 10; 5 sec hold  Great toe extension x 5                                                                                                                               PATIENT EDUCATION:  Education details: HEP review Person educated: Patient Education method: Medical Illustrator Education comprehension: verbalized understanding and returned demonstration  HOME EXERCISE PROGRAM: Access Code: [email or web address] URL: [email or web address] Date: 10/02/2024 Prepared by: Darice Conine  Exercises - Gastroc Stretch on Wall  - 1 x daily - 7 x weekly - 1 sets - 3 reps - 20-30 seconds hold - Soleus Stretch on Wall  - 1 x daily - 7 x weekly - 1 sets - 3 reps - 20-30 seconds hold - Seated Ankle Inversion Eversion PROM  - 1 x daily - 7 x weekly - 3 sets - 10 reps - Ankle Dorsiflexion with Resistance  - 1 x daily - 7 x weekly - 2 sets - 10 reps - Long Sitting Ankle Plantar Flexion with Resistance  - 1 x daily - 7 x weekly - 2 sets - 10 reps - Ankle Inversion Eversion Towel Slide  - 1 x daily - 7 x weekly - 3 sets - 10 reps - Towel Scrunches  - 1 x daily - 7 x weekly - 1 sets - 1 reps - 1 minute hold  ASSESSMENT:  CLINICAL IMPRESSION: Greater myofascial tightness and tension along lateral gastroc and soleus on Rt [employer] resisted heel raises in sitting with focus on eccentric control. Ankle supination compensation noted during towel scrunch exercise; tactile cue improved arch lifting and intrinsic activation of forefoot stability.   EVAL: Patient is a 29 y.o. female who was seen today for  physical therapy evaluation and treatment for closed nondisplaced fracture of body of Rt calcaneous. She presents with decreased Rt ankle strength and mobility, joint hypomobility, decreased balance and decreased functional activity tolerance. Pt will benefit from skilled PT to address deficits and progress towards PLOF.   OBJECTIVE IMPAIRMENTS: decreased activity tolerance, decreased balance, decreased mobility, decreased ROM, decreased strength, and pain.      GOALS: Goals reviewed with patient? Yes  SHORT TERM GOALS: Target date: 10/30/2024  Pt will be independent with initial HEP Baseline: Goal status: INITIAL  2.  Pt will demo SLS on Rt >= 20 seconds to demo improved balance Baseline:  Goal status: INITIAL    LONG TERM GOALS: Target date: 11/27/2024  Pt will  be independent with advanced HEP Baseline:  Goal status: INITIAL  2.  Pt will improve Rt ankle strength to 4/5 to return to full work activities  Baseline: see above Goal status: INITIAL  3.  Pt will improve Rt ankle AROM by 10 degrees in all directions to be able to walk barefoot with decreased pain Baseline:  Goal status: INITIAL  4.  Pt will improve PSFS to >= 3.5 to demo improved functional mobility Baseline:  Goal status: INITIAL    PLAN:  PT FREQUENCY: 2x/week  PT DURATION: 8 weeks  PLANNED INTERVENTIONS: 97164- PT Re-evaluation, 97110-Therapeutic exercises, 97530- Therapeutic activity, 97112- Neuromuscular re-education, 97535- Self Care, 02859- Manual therapy, [phone]- Gait training, [phone]- Aquatic Therapy, [phone]- Electrical stimulation (unattended), [phone]- Ionotophoresis 4mg /ml Dexamethasone, 79439 (1-2 muscles), 20561 (3+ muscles)- Dry Needling, Patient/Family education, Balance training, Stair training, Taping, Cryotherapy, and Moist heat  PLAN FOR NEXT SESSION: Rt ankle strength and mobility   Lamarr Price, PTA 10/18/2024, 2:49 PM   "

## 2024-10-23 ENCOUNTER — Encounter: Payer: Self-pay | Admitting: Physical Therapy

## 2024-10-23 ENCOUNTER — Ambulatory Visit: Admitting: Physical Therapy

## 2024-10-23 DIAGNOSIS — M25571 Pain in right ankle and joints of right foot: Secondary | ICD-10-CM

## 2024-10-23 DIAGNOSIS — M6281 Muscle weakness (generalized): Secondary | ICD-10-CM

## 2024-10-23 NOTE — Therapy (Signed)
 " OUTPATIENT PHYSICAL THERAPY LOWER EXTREMITY TREATMENT   Patient Name: Michele Ellison MRN: [id number] DOB:03-07-1996, 29 y.o., female Today's Date: 10/23/2024  END OF SESSION:  PT End of Session - 10/23/24 1543     Visit Number 5    Number of Visits 16    Date for Recertification  11/27/24    Authorization Type U[employer]    PT Start Time 1458    PT Stop Time 1538    PT Time Calculation (min) 40 min    Activity Tolerance Patient tolerated treatment well    Behavior During Therapy WFL for tasks assessed/performed          Past Medical History:  Diagnosis Date   Anxiety    Arrhythmogenic right ventricular cardiomyopathy (HCC)    Arthritis    Atrial fibrillation (HCC)    GERD (gastroesophageal reflux disease)    Hypertension    Migraine    Sprain of ulnar collateral ligament of metacarpophalangeal (MCP) joint of right thumb 07/03/2019   Past Surgical History:  Procedure Laterality Date   CLUB FOOT RELEASE     WISDOM TOOTH EXTRACTION     WISDOM TOOTH EXTRACTION     Patient Active Problem List   Diagnosis Date Noted   Polyarthralgia 07/27/2022   Radiculitis of left cervical region 06/11/2021   Right hand pain 10/30/2019   Intractable migraine with aura without status migrainosus 09/30/2017   Right ankle pain 05/27/2015    PCP: Willo Mini  REFERRING PROVIDER: Willo Mini  REFERRING DIAG: closed nondisplaced fracture of body of Rt calcaneous  THERAPY DIAG:  Muscle weakness (generalized)  Pain in right ankle and joints of right foot  Rationale for Evaluation and Treatment: Rehabilitation  ONSET DATE: 06/2024  SUBJECTIVE:   SUBJECTIVE STATEMENT: Patient reports she returned to work and has worked 1 shift so far. She states no pain while working but did have increased pain walking after sleeping after work. Pain lasts only a few minutes and then eases.  EVAL: Pt states she was running with her son and she stepped in a hole, fracturing her Rt calcaneous. She was  wearing a cam boot for ambulation until 1 week ago. She is wearing an soft ankle brace and a heel cushion in normal shoes. She states she must have the heel cushion due to increased pain walking without it. Pt is currently out of work and was advised by MD to complete PT before returning to work. She works at the [employer] 3-7 hour shifts. She states her foot has not been too painful with ADLs and IADLs and she is no longer taking pain medication. She hopes to be able to return to full work duties soon.  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 Yes: NPRS scale: 0/10 currently, 5/10 at worst Pain location: Rt heel Pain description: sore Aggravating factors: prolonged standing or walking Relieving factors: rest  PRECAUTIONS: None  RED FLAGS: None   WEIGHT BEARING RESTRICTIONS: WBAT  FALLS:  Has patient fallen in last 6 months? 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   PATIENT SURVEYS:  PSFS: THE PATIENT SPECIFIC FUNCTIONAL SCALE  Place score of 0-10 (0 = unable to perform activity and 10 = able to  perform activity at the same level as before injury or problem)  Activity Date: 10/02/24    Walk barefoot 1    2.full work duties 0    3.     4.      Average Score 0.5      Total Score = Sum of activity scores/number of activities  Minimally Detectable Change: 3 points (for single activity); 2 points (for average score)  [facility] (nd). The Patient Specific Functional Scale . Retrieved from [email or web address].[email or web address]     EDEMA:   None noted on eval  PALPATION: No TTP Hypomobile TC mobs Metatarsal and tarsal mobility WFL  LOWER EXTREMITY  ROM:  Active / Passive ROM Right eval Left eval  Hip flexion    Hip extension    Hip abduction    Hip adduction    Hip internal rotation    Hip external rotation    Knee flexion    Knee extension    Ankle dorsiflexion -4/0 [facility]  Ankle plantarflexion 20/25 [facility]  Ankle inversion 1/11 [facility]  Ankle eversion 7/11 WFL   (Blank rows = not tested)  LOWER EXTREMITY MMT:  MMT Right eval Left eval  Hip flexion    Hip extension    Hip abduction    Hip adduction    Hip internal rotation    Hip external rotation    Knee flexion    Knee extension    Ankle dorsiflexion 3 4  Ankle plantarflexion 3- 3+  Ankle inversion 3- 4  Ankle eversion 3- 4   (Blank rows = not tested)    FUNCTIONAL TESTS:   SLS Lt = 20 seconds, Rt = 4 seconds  GAIT: Distance walked: 100' Assistive device utilized: None Level of assistance: Complete Independence Comments: decreased DF Rt ankle, mild antalgic gait    [facility] Adult PT Treatment:                                                DATE: 10/23/24 Therapeutic Exercise: Standing gastroc and soleus stretches Seated PF stretch  Neuromuscular re-ed: Eccentric heel raise on 4'' step Standing rocker board ankle ev/inv Standing rocker board PF/DF Standing on airex: tandem stance with head turns 2 x 1 min Therapeutic Activity: Seated Towel scrunch/push out Toe yoga Seated ankle inv/ev Standing Heel raise toes straight/in/out with minimal UE support Attempted 4'' step down for DF stretch - limited by pain   [facility] Adult PT Treatment:                                                DATE: 10/18/2024 Manual Therapy: STM/IASTM gastroc & soleus Neuromuscular re-ed: Tandem balance  Seated eccentric (Rt) heel raise + 10#KB resting on knee x20 Therapeutic Activity: Seated: Toe yoga Ankle ever/inver on rocker board (assisted) Ankle PF/DF on rocker board Towel scrunch in & towel push out    [facility] Adult PT Treatment:                                                 DATE: 10/16/24 Therapeutic Exercise: Ankle circles 2  x 10; CW/CCW Seated ankle rockerboard A/P 2 x 10 Seated ankle inv/ev on towel 2 x 10 Seated toe raises 2 x 10  Toe flexion/extension Standing gastroc stretch Standing soleus stretch  Neuromuscular re-ed: Seated inv isometric x 5 - pt unable to complete more due to fatigue Tandem stance Rt foot in rear x 60 sec Tandem stance on foam Rt foot in rear 2 x 30 sec Seated ankle PF/DF and inv/ev on coregeous ball with manual assistance to improve ROM                                                                                                                               PATIENT EDUCATION:  Education details: HEP review Person educated: Patient Education method: [employer] Education comprehension: verbalized understanding and returned demonstration  HOME EXERCISE PROGRAM: Access Code: [email or web address] URL: [email or web address] Date: 10/23/2024 Prepared by: Darice Conine  Exercises - Gastroc Stretch on Wall  - 1 x daily - 7 x weekly - 1 sets - 3 reps - 20-30 seconds hold - Soleus Stretch on Wall  - 1 x daily - 7 x weekly - 1 sets - 3 reps - 20-30 seconds hold - Ankle Inversion Eversion Towel Slide  - 1 x daily - 7 x weekly - 3 sets - 10 reps - Towel Scrunches  - 1 x daily - 7 x weekly - 1 sets - 1 reps - 1 minute hold - Eccentric Heel Lowering on Step  - 1 x daily - 7 x weekly - 2 sets - 10 reps - Toe Yoga - Alternating Great Toe and Lesser Toe Extension  - 1 x daily - 7 x weekly - 1 sets - 10 reps - Seated Anterior Tibialis Stretch  - 1 x daily - 7 x weekly - 1 sets - 10 reps - 10 seconds hold  ASSESSMENT:  CLINICAL IMPRESSION: Pt is improving foot and ankle mobility and has good tolerance to work so far. She continues with decreased ankle ROM and decreased muscular endurance, requiring rest breaks due to foot and ankle fatigue during strengthening exercises. Plan to continue to progress as  tolerated  EVAL: Patient is a 29 y.o. female who was seen today for physical therapy evaluation and treatment for closed nondisplaced fracture of body of Rt calcaneous. She presents with decreased Rt ankle strength and mobility, joint hypomobility, decreased balance and decreased functional activity tolerance. Pt will benefit from skilled PT to address deficits and progress towards PLOF.   OBJECTIVE IMPAIRMENTS: decreased activity tolerance, decreased balance, decreased mobility, decreased ROM, decreased strength, and pain.      GOALS: Goals reviewed with patient? Yes  SHORT TERM GOALS: Target date: 10/30/2024  Pt will be independent with initial HEP Baseline: Goal status: INITIAL  2.  Pt will demo SLS on Rt >= 20 seconds to demo improved balance Baseline:  Goal status: INITIAL    LONG TERM GOALS: Target date: 11/27/2024  Pt will  be independent with advanced HEP Baseline:  Goal status: INITIAL  2.  Pt will improve Rt ankle strength to 4/5 to return to full work activities  Baseline: see above Goal status: INITIAL  3.  Pt will improve Rt ankle AROM by 10 degrees in all directions to be able to walk barefoot with decreased pain Baseline:  Goal status: INITIAL  4.  Pt will improve PSFS to >= 3.5 to demo improved functional mobility Baseline:  Goal status: INITIAL    PLAN:  PT FREQUENCY: 2x/week  PT DURATION: 8 weeks  PLANNED INTERVENTIONS: 97164- PT Re-evaluation, 97110-Therapeutic exercises, 97530- Therapeutic activity, 97112- Neuromuscular re-education, 97535- Self Care, 02859- Manual therapy, [phone]- Gait training, [phone]- Aquatic Therapy, [phone]- Electrical stimulation (unattended), [phone]- Ionotophoresis 4mg /ml Dexamethasone, 79439 (1-2 muscles), 20561 (3+ muscles)- Dry Needling, Patient/Family education, Balance training, Stair training, Taping, Cryotherapy, and Moist heat  PLAN FOR NEXT SESSION: Rt ankle strength and mobility   Darice Conine, PT,DPT1/13/20263:44  PM    "

## 2024-10-25 ENCOUNTER — Ambulatory Visit

## 2024-10-25 DIAGNOSIS — M6281 Muscle weakness (generalized): Secondary | ICD-10-CM

## 2024-10-25 DIAGNOSIS — M25571 Pain in right ankle and joints of right foot: Secondary | ICD-10-CM

## 2024-10-25 NOTE — Therapy (Signed)
 " OUTPATIENT PHYSICAL THERAPY LOWER EXTREMITY TREATMENT   Patient Name: Michele Ellison MRN: [id number] DOB:1996-04-04, 29 y.o., female Today's Date: 10/25/2024  END OF SESSION:  PT End of Session - 10/25/24 1450     Visit Number 6    Number of Visits 16    Date for Recertification  11/27/24    Authorization Type UHC    PT Start Time 1450    PT Stop Time 1528    PT Time Calculation (min) 38 min    Activity Tolerance Patient tolerated treatment well    Behavior During Therapy WFL for tasks assessed/performed          Past Medical History:  Diagnosis Date   Anxiety    Arrhythmogenic right ventricular cardiomyopathy (HCC)    Arthritis    Atrial fibrillation (HCC)    GERD (gastroesophageal reflux disease)    Hypertension    Migraine    Sprain of ulnar collateral ligament of metacarpophalangeal (MCP) joint of right thumb 07/03/2019   Past Surgical History:  Procedure Laterality Date   CLUB FOOT RELEASE     WISDOM TOOTH EXTRACTION     WISDOM TOOTH EXTRACTION     Patient Active Problem List   Diagnosis Date Noted   Polyarthralgia 07/27/2022   Radiculitis of left cervical region 06/11/2021   Right hand pain 10/30/2019   Intractable migraine with aura without status migrainosus 09/30/2017   Right ankle pain 05/27/2015    PCP: Willo Mini  REFERRING PROVIDER: Willo Mini  REFERRING DIAG: closed nondisplaced fracture of body of Rt calcaneous  THERAPY DIAG:  Muscle weakness (generalized)  Pain in right ankle and joints of right foot  Rationale for Evaluation and Treatment: Rehabilitation  ONSET DATE: 06/2024  SUBJECTIVE:   SUBJECTIVE STATEMENT: Patient reports she continues to have pain in ankle when first standing and moving around after sleeping but not as bad. Patient states she is back to a full work week averaging 5 hours per shift.   EVAL: Pt states she was running with her son and she stepped in a hole, fracturing her Rt calcaneous. She was wearing a cam  boot for ambulation until 1 week ago. She is wearing an soft ankle brace and a heel cushion in normal shoes. She states she must have the heel cushion due to increased pain walking without it. Pt is currently out of work and was advised by MD to complete PT before returning to work. She works at the [employer] 3-7 hour shifts. She states her foot has not been too painful with ADLs and IADLs and she is no longer taking pain medication. She hopes to be able to return to full work duties soon.  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 Yes: NPRS scale: 5/10 soreness Pain location: Rt heel Pain description: sore Aggravating factors: prolonged standing or walking Relieving factors: rest  PRECAUTIONS: None  RED FLAGS: None   WEIGHT BEARING RESTRICTIONS: WBAT  FALLS:  Has patient fallen in last 6 months? 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   PATIENT SURVEYS:  PSFS: THE PATIENT SPECIFIC FUNCTIONAL SCALE  Place score of 0-10 (0 = unable to perform activity and 10 = able to perform activity at the  same level as before injury or problem)  Activity Date: 10/02/24 10/25/24   Walk barefoot 1 6   2. full work duties 0 7   3.     4.      Average Score 0.5 15     Total Score = Sum of activity scores/number of activities  Minimally Detectable Change: 3 points (for single activity); 2 points (for average score)  [employer] [facility] (nd). The Patient Specific Functional Scale . Retrieved from [email or web address]     EDEMA:   None noted on eval  PALPATION: No TTP Hypomobile TC mobs Metatarsal and tarsal mobility WFL  LOWER EXTREMITY ROM:  Active / Passive  ROM Right eval Left eval  Hip flexion    Hip extension    Hip abduction    Hip adduction    Hip internal rotation    Hip external rotation    Knee flexion    Knee extension    Ankle dorsiflexion -4/0 [facility]  Ankle plantarflexion 20/25 [facility]  Ankle inversion 1/11 [facility]  Ankle eversion 7/11 WFL   (Blank rows = not tested)  LOWER EXTREMITY MMT:  MMT Right eval Left eval  Hip flexion    Hip extension    Hip abduction    Hip adduction    Hip internal rotation    Hip external rotation    Knee flexion    Knee extension    Ankle dorsiflexion 3 4  Ankle plantarflexion 3- 3+  Ankle inversion 3- 4  Ankle eversion 3- 4   (Blank rows = not tested)    FUNCTIONAL TESTS:   SLS Lt = 20 seconds, Rt = 4 seconds  GAIT: Distance walked: 100' Assistive device utilized: None Level of assistance: Complete Independence Comments: decreased DF Rt ankle, mild antalgic gait   [facility] Adult PT Treatment:                                                DATE: 10/25/2024 Manual Therapy: Roller stick/STM Rt gastroc/soleus Neuromuscular re-ed: Plantar flexion + blue TB Dorsiflexion + green TB  EVE/INV + yellow TB --> no resistance (too much) Standing ankle pronation supination Therapeutic Activity: Seated heel/toe raises Ankle eve/inv AROM Ankle DF/PF on rocker board with resistance  Standing towel scrunch Standing toe yoga    [facility] Adult PT Treatment:                                                DATE: 10/23/24 Therapeutic Exercise: Standing gastroc and soleus stretches Seated PF stretch  Neuromuscular re-ed: Eccentric heel raise on 4'' step Standing rocker board ankle ev/inv Standing rocker board PF/DF Standing on airex: tandem stance with head turns 2 x 1 min Therapeutic Activity: Seated Towel scrunch/push out Toe yoga Seated ankle inv/ev Standing Heel raise toes straight/in/out with minimal UE support Attempted 4'' step down for DF stretch - limited by pain   [facility] Adult PT  Treatment:                                                DATE: 10/18/2024 Manual  Therapy: STM/IASTM gastroc & soleus Neuromuscular re-ed: Tandem balance  Seated eccentric (Rt) heel raise + 10#KB resting on knee x20 Therapeutic Activity: Seated: Toe yoga Ankle ever/inver on rocker board (assisted) Ankle PF/DF on rocker board Towel scrunch in & towel push out                                                                                                                              PATIENT EDUCATION:  Education details: HEP review Person educated: Patient Education method: Medical Illustrator Education comprehension: verbalized understanding and returned demonstration  HOME EXERCISE PROGRAM: Access Code: [email or web address] URL: [email or web address] Date: 10/25/2024 Prepared by: Lamarr Price  Exercises - Gastroc Stretch on Wall  - 1 x daily - 7 x weekly - 1 sets - 3 reps - 20-30 seconds hold - Soleus Stretch on Wall  - 1 x daily - 7 x weekly - 1 sets - 3 reps - 20-30 seconds hold - Ankle Inversion Eversion Towel Slide  - 1 x daily - 7 x weekly - 3 sets - 10 reps - Towel Scrunches  - 1 x daily - 7 x weekly - 1 sets - 1 reps - 1 minute hold - Eccentric Heel Lowering on Step  - 1 x daily - 7 x weekly - 2 sets - 10 reps - Toe Yoga - Alternating Great Toe and Lesser Toe Extension  - 1 x daily - 7 x weekly - 1 sets - 10 reps - Seated Anterior Tibialis Stretch  - 1 x daily - 7 x weekly - 1 sets - 10 reps - 10 seconds hold - Long Sitting Ankle Dorsiflexion with Anchored Resistance  - 1 x daily - 7 x weekly - 3 sets - 10 reps - Long Sitting Ankle Plantar Flexion with Resistance  - 1 x daily - 7 x weekly - 3 sets - 10 reps - Seated Ankle Supination Pronation With Foot on Ground  - 1 x daily - 7 x weekly - 3 sets - 10 reps  ASSESSMENT:  CLINICAL IMPRESSION: Added resistance with ankle plantarflexion and dorsiflexion; attempted light resistance with ankle eversion/inversion,  however did not tolerate well and recommended patient continue with AROM with towel slides. Ankle strategy and mobility mechanics progressed with standing ankle pronation/supination; noted stiffness with medial/lateral ankle mobility.   EVAL: Patient is a 29 y.o. female who was seen today for physical therapy evaluation and treatment for closed nondisplaced fracture of body of Rt calcaneous. She presents with decreased Rt ankle strength and mobility, joint hypomobility, decreased balance and decreased functional activity tolerance. Pt will benefit from skilled PT to address deficits and progress towards PLOF.   OBJECTIVE IMPAIRMENTS: decreased activity tolerance, decreased balance, decreased mobility, decreased ROM, decreased strength, and pain.      GOALS: Goals reviewed with patient? Yes   SHORT TERM GOALS: Target date: 10/30/2024  Pt will be independent with initial HEP Baseline: Goal status: INITIAL  2.  Pt  will demo SLS on Rt >= 20 seconds to demo improved balance Baseline:  Goal status: INITIAL    LONG TERM GOALS: Target date: 11/27/2024  Pt will be independent with advanced HEP Baseline:  Goal status: INITIAL  2.  Pt will improve Rt ankle strength to 4/5 to return to full work activities  Baseline: see above Goal status: INITIAL  3.  Pt will improve Rt ankle AROM by 10 degrees in all directions to be able to walk barefoot with decreased pain Baseline:  Goal status: INITIAL  4.  Pt will improve PSFS to >= 3.5 to demo improved functional mobility Baseline:  10/25/24: 15 Goal status: MET    PLAN:  PT FREQUENCY: 2x/week  PT DURATION: 8 weeks  PLANNED INTERVENTIONS: 97164- PT Re-evaluation, 97110-Therapeutic exercises, 97530- Therapeutic activity, 97112- Neuromuscular re-education, 97535- Self Care, 02859- Manual therapy, [phone]- Gait training, [phone]- Aquatic Therapy, [phone]- Electrical stimulation (unattended), [phone]- Ionotophoresis 4mg /ml Dexamethasone, 79439 (1-2  muscles), 20561 (3+ muscles)- Dry Needling, Patient/Family education, Balance training, Stair training, Taping, Cryotherapy, and Moist heat  PLAN FOR NEXT SESSION: Rt ankle strength and mobility   Lamarr Price, PTA 10/25/24, 3:31 PM    "
